# Patient Record
Sex: Male | Born: 1970 | Race: White | Hispanic: No | Marital: Married | Smoking: Never smoker
Health system: Southern US, Community
[De-identification: ages and names within clinical notes are randomized; demographics above are authoritative.]

## PROBLEM LIST (undated history)

## (undated) DIAGNOSIS — M109 Gout, unspecified: Secondary | ICD-10-CM

## (undated) DIAGNOSIS — M1990 Unspecified osteoarthritis, unspecified site: Secondary | ICD-10-CM

## (undated) HISTORY — PX: APPENDECTOMY: SHX54

---

## 2020-05-20 ENCOUNTER — Emergency Department (HOSPITAL_COMMUNITY): Payer: No Typology Code available for payment source

## 2020-05-20 ENCOUNTER — Other Ambulatory Visit: Payer: Self-pay

## 2020-05-20 ENCOUNTER — Encounter (HOSPITAL_COMMUNITY): Payer: Self-pay | Admitting: Emergency Medicine

## 2020-05-20 ENCOUNTER — Emergency Department (HOSPITAL_COMMUNITY)
Admission: EM | Admit: 2020-05-20 | Discharge: 2020-05-20 | Disposition: A | Discharge status: Home / Self Care | Payer: No Typology Code available for payment source | Attending: Emergency Medicine | Admitting: Emergency Medicine

## 2020-05-20 DIAGNOSIS — R109 Unspecified abdominal pain: Secondary | ICD-10-CM | POA: Insufficient documentation

## 2020-05-20 DIAGNOSIS — R030 Elevated blood-pressure reading, without diagnosis of hypertension: Secondary | ICD-10-CM | POA: Diagnosis not present

## 2020-05-20 DIAGNOSIS — M791 Myalgia, unspecified site: Secondary | ICD-10-CM | POA: Insufficient documentation

## 2020-05-20 LAB — CBC
HCT: 45.6 % (ref 39.0–52.0)
Hemoglobin: 13.7 g/dL (ref 13.0–17.0)
MCH: 23.5 pg — ABNORMAL LOW (ref 26.0–34.0)
MCHC: 30 g/dL (ref 30.0–36.0)
MCV: 78.2 fL — ABNORMAL LOW (ref 80.0–100.0)
Platelets: 331 10*3/uL (ref 150–400)
RBC: 5.83 MIL/uL — ABNORMAL HIGH (ref 4.22–5.81)
RDW: 19.5 % — ABNORMAL HIGH (ref 11.5–15.5)
WBC: 7.8 10*3/uL (ref 4.0–10.5)
nRBC: 0 % (ref 0.0–0.2)

## 2020-05-20 LAB — URINALYSIS, ROUTINE W REFLEX MICROSCOPIC
Bilirubin Urine: NEGATIVE
Glucose, UA: NEGATIVE mg/dL
Hgb urine dipstick: NEGATIVE
Ketones, ur: NEGATIVE mg/dL
Leukocytes,Ua: NEGATIVE
Nitrite: NEGATIVE
Protein, ur: NEGATIVE mg/dL
Specific Gravity, Urine: 1.008 (ref 1.005–1.030)
pH: 6 (ref 5.0–8.0)

## 2020-05-20 LAB — BASIC METABOLIC PANEL
Anion gap: 10 (ref 5–15)
BUN: 21 mg/dL — ABNORMAL HIGH (ref 6–20)
CO2: 24 mmol/L (ref 22–32)
Calcium: 8.7 mg/dL — ABNORMAL LOW (ref 8.9–10.3)
Chloride: 103 mmol/L (ref 98–111)
Creatinine, Ser: 1.33 mg/dL — ABNORMAL HIGH (ref 0.61–1.24)
GFR calc Af Amer: >60 mL/min (ref >60)
GFR calc non Af Amer: >60 mL/min (ref >60)
Glucose, Bld: 118 mg/dL — ABNORMAL HIGH (ref 70–99)
Potassium: 4.2 mmol/L (ref 3.5–5.1)
Sodium: 137 mmol/L (ref 135–145)

## 2020-05-20 LAB — LIPASE, BLOOD: Lipase: 36 U/L (ref 11–51)

## 2020-05-20 MED ORDER — ONDANSETRON HCL 4 MG/2ML IJ SOLN
4.0000 mg | Freq: Once | INTRAMUSCULAR | Status: AC
  Administered 2020-05-20: 4 mg via INTRAVENOUS
  Filled 2020-05-20: qty 2

## 2020-05-20 MED ORDER — FENTANYL CITRATE (PF) 100 MCG/2ML IJ SOLN
100.0000 ug | Freq: Once | INTRAMUSCULAR | Status: AC
  Administered 2020-05-20: 100 ug via INTRAVENOUS
  Filled 2020-05-20: qty 2

## 2020-05-20 MED ORDER — SODIUM CHLORIDE 0.9 % IV BOLUS (SEPSIS)
1000.0000 mL | Freq: Once | INTRAVENOUS | Status: AC
  Administered 2020-05-20: 1000 mL via INTRAVENOUS

## 2020-05-20 NOTE — ED Triage Notes (Signed)
Patient states right abdominal and right sided flank pain that started on Tuesday with vomiting. Patient states that he had his second COVID vaccine on 05/18/20 and states numbness in his legs.

## 2020-05-20 NOTE — ED Provider Notes (Signed)
[facility] EMERGENCY DEPARTMENT Provider Note   CSN: [id number] Arrival date & time: 05/20/20  0533     History Chief Complaint  Patient presents with  . Flank Pain    right    Nicholas Harper is a 49 y.o. male.  The history is provided by the patient.  Flank Pain This is a new problem. The current episode started 2 days ago. The problem occurs constantly. The problem has been gradually worsening. Associated symptoms include abdominal pain. Pertinent negatives include no chest pain and no shortness of breath. Exacerbated by: Certain positions. Nothing relieves the symptoms. He has tried nothing for the symptoms.  Patient reports over 2 days ago he began having right flank pain and bruising to his abdomen.  No fevers or vomiting.  No difficulty urinating, but does report dark urine.  No groin pain.  He reports this feels similar to when he was told he had a fatty liver 18 months ago He reports history of kidney stones over 20 years ago Patient also reports muscle tightness in his arms and legs since receiving Covid vaccine yesterday.  The abdominal pain preceded the Covid vaccine No new arm or leg weakness.  No incontinence    PMH-"fatty liver" Social History   Tobacco Use  . Smoking status: Never Smoker  . Smokeless tobacco: Never Used  Substance Use Topics  . Alcohol use: Not Currently  . Drug use: Never    Home Medications Prior to Admission medications   Not on File    Allergies    Patient has no allergy information on record.  Review of Systems   Review of Systems  Constitutional: Negative for fever.  Respiratory: Negative for shortness of breath.   Cardiovascular: Negative for chest pain.  Gastrointestinal: Positive for abdominal pain.  Genitourinary: Positive for flank pain. Negative for testicular pain.  Musculoskeletal: Positive for myalgias.  All other systems reviewed and are negative.   Physical Exam Updated Vital Signs BP (!) 160/101 (BP Location:  Left Arm)   Pulse 73   Temp (!) 97.5 F (36.4 C) (Oral)   Resp 18   Ht 1.778 m (5\' 10" )   Wt 102.1 kg   SpO2 99%   BMI 32.28 kg/m   Physical Exam CONSTITUTIONAL: Well developed/well nourished, mildly uncomfortable appearing HEAD: Normocephalic/atraumatic EYES: EOMI/PERRL ENMT: Mucous membranes moist NECK: supple no meningeal signs SPINE/BACK:entire spine nontender CV: S1/S2 noted, no murmurs/rubs/gallops noted LUNGS: Lungs are clear to auscultation bilaterally, no apparent distress ABDOMEN: soft, mild RUQ tenderness, no organomegaly, no rebound or guarding, bowel sounds noted throughout abdomen GU: Right cva tenderness NEURO: Pt is awake/alert/appropriate, moves all extremitiesx4.  No facial droop.  No focal motor deficits noted lower extremity EXTREMITIES: pulses normal/equal, full ROM SKIN: warm, diaphoretic PSYCH: no abnormalities of mood noted, alert and oriented to situation  ED Results / Procedures / Treatments   Labs (all labs ordered are listed, but only abnormal results are displayed) Labs Reviewed  CBC - Abnormal; Notable for the following components:      Result Value   RBC 5.83 (*)    MCV 78.2 (*)    MCH 23.5 (*)    RDW 19.5 (*)    All other components within normal limits  BASIC METABOLIC PANEL - Abnormal; Notable for the following components:   Glucose, Bld 118 (*)    BUN 21 (*)    Creatinine, Ser 1.33 (*)    Calcium 8.7 (*)    All other components within normal limits  LIPASE, BLOOD  URINALYSIS, ROUTINE W REFLEX MICROSCOPIC    EKG None  Radiology No results found.  Procedures Procedures    Medications Ordered in ED Medications  fentaNYL (SUBLIMAZE) injection 100 mcg (100 mcg Intravenous Given 05/20/20 0632)  ondansetron (ZOFRAN) injection 4 mg (4 mg Intravenous Given 05/20/20 9381)    ED Course  I have reviewed the triage vital signs and the nursing notes.  Pertinent labs & imaging results that were available during my care of the patient  were reviewed by me and considered in my medical decision making (see chart for details).    MDM Rules/Calculators/A&P                       This patient presents to the ED for concern of abdominal pain and flank pain, this involves an extensive number of treatment options, and is a complaint that carries with it a high risk of complications and morbidity.  The differential diagnosis includes ureteral stone, pyelonephritis, cholecystitis, pancreatitis   Lab Tests:   I Ordered, reviewed, and interpreted labs, which included urinalysis, electrolytes, complete blood count, lipase  Medicines ordered:   I ordered medication fentanyl for pain  Imaging Studies ordered:   I ordered imaging studies which included CT renal study    6:49 AM Patient presents with right flank pain of unclear etiology.  Patient appears uncomfortable.  High suspicion for ureteral stone.  Will order CT renal study.  Urinalysis pending 7:04 AM Signed out to dr Ashok Cordia at shift change  Final Clinical Impression(s) / ED Diagnoses Final diagnoses:  Flank pain    Rx / DC Orders ED Discharge Orders    None       Ripley Fraise, MD 05/20/20 [phone]

## 2020-05-20 NOTE — ED Provider Notes (Signed)
Signed out that pt getting renal ct, r/o ureteral stone - check ct and d/c to home.   CT neg for ureteral stone. Ct does not bil kidney stones, and other incidental findings - discussed w pt, and rec pcp f/u.  Pt denies chronic or recurrent abd pain. No acute or chronic diarrhea. Currently no pain. abd soft nt. Afebrile.   Pt appears stable for d/c.      Cathren Laine, MD 05/20/20 [phone]

## 2020-05-20 NOTE — Discharge Instructions (Addendum)
It was our pleasure to provide your ER care today - we hope that you feel better.  Your ct scan was read as showing no obstructing kidney stone, however, several incidental findings were noted (see below, and discuss with primary care doctor at follow up). IMPRESSION: 1. Low-attenuation in the submucosa of distal small bowel and proximal colon. Findings could be seen in the setting of mild or prior enterocolitis. No significant pericolonic or perienteric stranding. Correlate with any history of recurrent abdominal pain. This finding can be seen in the setting of inflammatory bowel disease but is nonspecific. 2. No evidence of hydronephrosis or perinephric fluid. Minimal perinephric stranding bilaterally. Findings may be chronic, no prior exams for comparison. Correlation with urinalysis may be helpful. 3. Nonobstructive bilateral nephrolithiasis. 4. Hepatic steatosis. 5. Colonic diverticulosis without evidence of acute diverticulitis.   Your blood pressure is mildly high today - follow up with primary care doctor for recheck in 1-2 weeks.   Return to ER if worse, new symptoms, fevers, worsening or severe pain, persistent vomiting, or other concern.

## 2021-01-26 ENCOUNTER — Emergency Department (HOSPITAL_COMMUNITY): Payer: BC Managed Care – PPO

## 2021-01-26 ENCOUNTER — Encounter (HOSPITAL_COMMUNITY): Payer: Self-pay

## 2021-01-26 ENCOUNTER — Other Ambulatory Visit: Payer: Self-pay

## 2021-01-26 ENCOUNTER — Emergency Department (HOSPITAL_COMMUNITY)
Admission: EM | Admit: 2021-01-26 | Discharge: 2021-01-26 | Disposition: A | Discharge status: Home / Self Care | Payer: BC Managed Care – PPO | Attending: Emergency Medicine | Admitting: Emergency Medicine

## 2021-01-26 DIAGNOSIS — R609 Edema, unspecified: Secondary | ICD-10-CM | POA: Insufficient documentation

## 2021-01-26 DIAGNOSIS — R072 Precordial pain: Secondary | ICD-10-CM | POA: Diagnosis not present

## 2021-01-26 DIAGNOSIS — R55 Syncope and collapse: Secondary | ICD-10-CM | POA: Diagnosis not present

## 2021-01-26 DIAGNOSIS — R0602 Shortness of breath: Secondary | ICD-10-CM | POA: Diagnosis present

## 2021-01-26 DIAGNOSIS — U071 COVID-19: Secondary | ICD-10-CM | POA: Insufficient documentation

## 2021-01-26 LAB — CBC WITH DIFFERENTIAL/PLATELET
Abs Immature Granulocytes: 0.02 10*3/uL (ref 0.00–0.07)
Basophils Absolute: 0 10*3/uL (ref 0.0–0.1)
Basophils Relative: 0 %
Eosinophils Absolute: 0.1 10*3/uL (ref 0.0–0.5)
Eosinophils Relative: 1 %
HCT: 49 % (ref 39.0–52.0)
Hemoglobin: 15.6 g/dL (ref 13.0–17.0)
Immature Granulocytes: 0 %
Lymphocytes Relative: 22 %
Lymphs Abs: 1.8 10*3/uL (ref 0.7–4.0)
MCH: 26.3 pg (ref 26.0–34.0)
MCHC: 31.8 g/dL (ref 30.0–36.0)
MCV: 82.6 fL (ref 80.0–100.0)
Monocytes Absolute: 0.7 10*3/uL (ref 0.1–1.0)
Monocytes Relative: 9 %
Neutro Abs: 5.5 10*3/uL (ref 1.7–7.7)
Neutrophils Relative %: 68 %
Platelets: 261 10*3/uL (ref 150–400)
RBC: 5.93 MIL/uL — ABNORMAL HIGH (ref 4.22–5.81)
RDW: 16.4 % — ABNORMAL HIGH (ref 11.5–15.5)
WBC: 8.2 10*3/uL (ref 4.0–10.5)
nRBC: 0 % (ref 0.0–0.2)

## 2021-01-26 LAB — URINALYSIS, ROUTINE W REFLEX MICROSCOPIC
Bilirubin Urine: NEGATIVE
Glucose, UA: NEGATIVE mg/dL
Hgb urine dipstick: NEGATIVE
Ketones, ur: NEGATIVE mg/dL
Leukocytes,Ua: NEGATIVE
Nitrite: NEGATIVE
Protein, ur: NEGATIVE mg/dL
Specific Gravity, Urine: 1.016 (ref 1.005–1.030)
pH: 5 (ref 5.0–8.0)

## 2021-01-26 LAB — COMPREHENSIVE METABOLIC PANEL
ALT: 53 U/L — ABNORMAL HIGH (ref 0–44)
AST: 35 U/L (ref 15–41)
Albumin: 4 g/dL (ref 3.5–5.0)
Alkaline Phosphatase: 46 U/L (ref 38–126)
Anion gap: 7 (ref 5–15)
BUN: 15 mg/dL (ref 6–20)
CO2: 21 mmol/L — ABNORMAL LOW (ref 22–32)
Calcium: 8.5 mg/dL — ABNORMAL LOW (ref 8.9–10.3)
Chloride: 108 mmol/L (ref 98–111)
Creatinine, Ser: 1.06 mg/dL (ref 0.61–1.24)
GFR, Estimated: >60 mL/min (ref >60)
Glucose, Bld: 119 mg/dL — ABNORMAL HIGH (ref 70–99)
Potassium: 4 mmol/L (ref 3.5–5.1)
Sodium: 136 mmol/L (ref 135–145)
Total Bilirubin: 0.9 mg/dL (ref 0.3–1.2)
Total Protein: 7.4 g/dL (ref 6.5–8.1)

## 2021-01-26 LAB — D-DIMER, QUANTITATIVE: D-Dimer, Quant: 0.6 ug/mL-FEU — ABNORMAL HIGH (ref 0.00–0.50)

## 2021-01-26 LAB — BRAIN NATRIURETIC PEPTIDE: B Natriuretic Peptide: 24 pg/mL (ref 0.0–100.0)

## 2021-01-26 LAB — TROPONIN I (HIGH SENSITIVITY): Troponin I (High Sensitivity): 5 ng/L (ref <18)

## 2021-01-26 MED ORDER — LACTATED RINGERS IV BOLUS
1000.0000 mL | Freq: Once | INTRAVENOUS | Status: AC
  Administered 2021-01-26: 1000 mL via INTRAVENOUS

## 2021-01-26 MED ORDER — NAPROXEN 500 MG PO TABS
500.0000 mg | ORAL_TABLET | Freq: Two times a day (BID) | ORAL | 0 refills | Status: DC
Start: 2021-01-26 — End: 2021-05-04

## 2021-01-26 MED ORDER — IOHEXOL 350 MG/ML SOLN
100.0000 mL | Freq: Once | INTRAVENOUS | Status: AC | PRN
  Administered 2021-01-26: 100 mL via INTRAVENOUS

## 2021-01-26 NOTE — ED Notes (Signed)
Back in room from CT

## 2021-01-26 NOTE — Discharge Instructions (Addendum)
Follow up with Dr. Dallas Schimke next week for your arm.   Follow up with a family md if any problems

## 2021-01-26 NOTE — ED Triage Notes (Addendum)
Pt says he tested + last Thursday. Says that a "knot" popped up on his left elbow yesterday and has swelling in his right hand. States he "blacked out" for 2 hours today. Complains of fevers and some blood when coughs. Denies SOB.

## 2021-01-26 NOTE — ED Provider Notes (Signed)
[facility] EMERGENCY DEPARTMENT Provider Note   CSN: [id number] Arrival date & time: 01/26/21  0454     History Chief Complaint  Patient presents with  . Covid Positive    Nicholas Harper is a 50 y.o. male.  Known covid. Now with RUE edema, syncope, sob and mild chest pain.    Chest Pain Pain location:  Substernal area Pain quality: aching and sharp   Pain radiates to:  Does not radiate Pain severity:  Mild Duration:  3 days Timing:  Constant Chronicity:  New Relieved by:  None tried Worsened by:  Nothing Ineffective treatments:  None tried Associated symptoms: cough and syncope   Associated symptoms: no anxiety, no back pain, no dizziness and no lower extremity edema        History reviewed. No pertinent past medical history.  There are no problems to display for this patient.   History reviewed. No pertinent surgical history.     No family history on file.  Social History   Tobacco Use  . Smoking status: Never Smoker  . Smokeless tobacco: Never Used  Substance Use Topics  . Alcohol use: Not Currently  . Drug use: Never    Home Medications Prior to Admission medications   Not on File    Allergies    Patient has no allergy information on record.  Review of Systems   Review of Systems  Respiratory: Positive for cough.   Cardiovascular: Positive for chest pain and syncope.  Musculoskeletal: Negative for back pain.  Neurological: Negative for dizziness.  All other systems reviewed and are negative.   Physical Exam Updated Vital Signs BP 129/84   Pulse 72   Temp 97.8 F (36.6 C) (Oral)   Resp (!) 24   Ht 5\' 10"  (1.778 m)   Wt 95.3 kg   SpO2 100%   BMI 30.13 kg/m   Physical Exam Vitals and nursing note reviewed.  Constitutional:      Appearance: He is well-developed and well-nourished.  HENT:     Head: Normocephalic and atraumatic.     Nose: No congestion or rhinorrhea.     Mouth/Throat:     Mouth: Mucous membranes are moist.      Pharynx: Oropharynx is clear.  Eyes:     Pupils: Pupils are equal, round, and reactive to light.  Cardiovascular:     Rate and Rhythm: Normal rate.  Pulmonary:     Effort: Pulmonary effort is normal. No respiratory distress.  Abdominal:     General: Abdomen is flat. There is no distension.  Musculoskeletal:        General: Swelling (RUE) present. No tenderness. Normal range of motion.     Cervical back: Normal range of motion.  Skin:    General: Skin is warm and dry.  Neurological:     General: No focal deficit present.     Mental Status: He is alert.     ED Results / Procedures / Treatments   Labs (all labs ordered are listed, but only abnormal results are displayed) Labs Reviewed  CBC WITH DIFFERENTIAL/PLATELET - Abnormal; Notable for the following components:      Result Value   RBC 5.93 (*)    RDW 16.4 (*)    All other components within normal limits  COMPREHENSIVE METABOLIC PANEL - Abnormal; Notable for the following components:   CO2 21 (*)    Glucose, Bld 119 (*)    Calcium 8.5 (*)    ALT 53 (*)  All other components within normal limits  D-DIMER, QUANTITATIVE (NOT AT [facility]) - Abnormal; Notable for the following components:   D-Dimer, Quant 0.60 (*)    All other components within normal limits  BRAIN NATRIURETIC PEPTIDE  URINALYSIS, ROUTINE W REFLEX MICROSCOPIC  TROPONIN I (HIGH SENSITIVITY)    EKG EKG Interpretation  Date/Time:  Thursday January 26 2021 06:00:16 EST Ventricular Rate:  79 PR Interval:    QRS Duration: 94 QT Interval:  379 QTC Calculation: 435 R Axis:   -56 Text Interpretation: Sinus rhythm Left axis deviation No old tracing to compare Confirmed by Marily Memos [phone]) on 01/26/2021 6:14:48 AM   Radiology DG Chest Portable 1 View  Result Date: 01/26/2021 CLINICAL DATA:  50 year old male with fever coughing and hemoptysis. Positive COVID-19. For 1 week. EXAM: PORTABLE CHEST 1 VIEW COMPARISON:  CT Abdomen and Pelvis 05/20/2020.  FINDINGS: Portable AP upright view at 0555 hours. Normal lung volumes and mediastinal contours. Allowing for portable technique the lungs are clear. No pneumothorax. Visualized tracheal air column is within normal limits. Negative visible osseous structures. IMPRESSION: Negative portable chest. Electronically Signed   By: Odessa Fleming M.D.   On: 01/26/2021 06:04    Procedures Procedures   Medications Ordered in ED Medications  lactated ringers bolus 1,000 mL (1,000 mLs Intravenous New Bag/Given 01/26/21 0608)    ED Course  I have reviewed the triage vital signs and the nursing notes.  Pertinent labs & imaging results that were available during my care of the patient were reviewed by me and considered in my medical decision making (see chart for details).    MDM Rules/Calculators/A&P                         Apparently has a history of syncopal episodes multiple times.  This month sounds more orthostatic in nature.  We will going give some fluids, check a D-dimer, EKG and basic labs.  Based on those will work-up as appropriate Symptoms improved however his D-dimer slightly elevated so will order: Ct for PE [place] for DVT  Care transferred pending both and reevaluation for ultimate disposition per.   Final Clinical Impression(s) / ED Diagnoses Final diagnoses:  Swelling    Rx / DC Orders ED Discharge Orders    None       Analeese Andreatta, Barbara Cower, MD 01/27/21 [phone]

## 2021-01-26 NOTE — ED Notes (Signed)
Pt to CT

## 2021-03-03 ENCOUNTER — Other Ambulatory Visit: Payer: Self-pay

## 2021-03-03 ENCOUNTER — Encounter: Payer: Self-pay | Admitting: Emergency Medicine

## 2021-03-03 ENCOUNTER — Ambulatory Visit
Admission: EM | Admit: 2021-03-03 | Discharge: 2021-03-03 | Disposition: A | Discharge status: Home / Self Care | Payer: BC Managed Care – PPO | Attending: Emergency Medicine | Admitting: Emergency Medicine

## 2021-03-03 DIAGNOSIS — M2550 Pain in unspecified joint: Secondary | ICD-10-CM

## 2021-03-03 DIAGNOSIS — M2540 Effusion, unspecified joint: Secondary | ICD-10-CM

## 2021-03-03 HISTORY — DX: Gout, unspecified: M10.9

## 2021-03-03 MED ORDER — PREDNISONE 10 MG (21) PO TBPK
ORAL_TABLET | Freq: Every day | ORAL | 0 refills | Status: DC
Start: 2021-03-03 — End: 2021-05-04

## 2021-03-03 NOTE — ED Provider Notes (Signed)
[facility] CARE CENTER   [id number] 03/03/21 Arrival Time: 1054  CC: Multiple joint pain  SUBJECTIVE: History from: patient. Nicholas Harper is a 50 y.o. male complains of multiple joint pain x 2 days.  Denies a precipitating event or specific injury.  Diffuse about RT hand, LT elbow, LT knee, and bilateral feet.  Describes the pain as intermittent and burning/ sharp in character.  Has tried naproxen without relief.  Symptoms are made worse with to the touch.  Reports similar symptoms in the past with arthritis flare, improved with prednisone taper.  Denies fever, chills, erythema, ecchymosis, effusion, weakness.  Has appt with arthritis clinic in April  ROS: As per HPI.  All other pertinent ROS negative.     Past Medical History:  Diagnosis Date  . Gout    Past Surgical History:  Procedure Laterality Date  . APPENDECTOMY     No Known Allergies No current facility-administered medications on file prior to encounter.   Current Outpatient Medications on File Prior to Encounter  Medication Sig Dispense Refill  . naproxen (NAPROSYN) 500 MG tablet Take 1 tablet (500 mg total) by mouth 2 (two) times daily. 30 tablet 0   Social History   Socioeconomic History  . Marital status: Married    Spouse name: Not on file  . Number of children: Not on file  . Years of education: Not on file  . Highest education level: Not on file  Occupational History  . Not on file  Tobacco Use  . Smoking status: Never Smoker  . Smokeless tobacco: Never Used  Substance and Sexual Activity  . Alcohol use: Not Currently  . Drug use: Never  . Sexual activity: Yes  Other Topics Concern  . Not on file  Social History Narrative  . Not on file   Social Determinants of Health   Financial Resource Strain: Not on file  Food Insecurity: Not on file  Transportation Needs: Not on file  Physical Activity: Not on file  Stress: Not on file  Social Connections: Not on file  Intimate Partner Violence: Not on  file   No family history on file.  OBJECTIVE:  Vitals:   03/03/21 1111  BP: (!) 139/93  Pulse: 92  Resp: 19  Temp: 98.3 F (36.8 C)  TempSrc: Oral  SpO2: 95%    General appearance: ALERT; in no acute distress.  Head: NCAT Lungs: Normal respiratory effort Musculoskeletal: Inspection: mild diffuse swelling over RT hand, LT elbow, and LT knee Palpation: Diffuse TTP over RT hand, LT elbow, LT knee and bilateral feet Skin: warm and dry Neurologic: Ambulates with minimal difficulty Psychological: alert and cooperative; normal mood and affect  ASSESSMENT & PLAN:  1. Multiple joint pain   2. Joint swelling    Meds ordered this encounter  Medications  . predniSONE (STERAPRED UNI-PAK 21 TAB) 10 MG (21) TBPK tablet    Sig: Take by mouth daily. Take 6 tabs by mouth daily  for 2 days, then 5 tabs for 2 days, then 4 tabs for 2 days, then 3 tabs for 2 days, 2 tabs for 2 days, then 1 tab by mouth daily for 2 days    Dispense:  42 tablet    Refill:  0    Order Specific Question:   Supervising Provider    Answer:   Eustace Moore [[id number]]    Continue conservative management of rest, ice, and gentle stretches Prednisone taper prescribed.  Take as directed and to completion Follow up with arthritis  clinic for further evaluation and management Return or go to the ER if you have any new or worsening symptoms (fever, chills, chest pain, redness, swelling, bruising, etc...)   Reviewed expectations re: course of current medical issues. Questions answered. Outlined signs and symptoms indicating need for more acute intervention. Patient verbalized understanding. After Visit Summary given.    Rennis Harding, PA-C 03/03/21 1138

## 2021-03-03 NOTE — Discharge Instructions (Signed)
Continue conservative management of rest, ice, and gentle stretches Prednisone taper prescribed.  Take as directed and to completion Follow up with arthritis clinic for further evaluation and management Return or go to the ER if you have any new or worsening symptoms (fever, chills, chest pain, redness, swelling, bruising, etc...)

## 2021-03-03 NOTE — ED Triage Notes (Signed)
Swelling and pain to RT hand, bilateral feet, LT knee and elbow since Wednesday.

## 2021-05-04 ENCOUNTER — Other Ambulatory Visit: Payer: Self-pay

## 2021-05-04 ENCOUNTER — Ambulatory Visit
Admission: EM | Admit: 2021-05-04 | Discharge: 2021-05-04 | Disposition: A | Discharge status: Home / Self Care | Payer: BC Managed Care – PPO | Attending: Emergency Medicine | Admitting: Emergency Medicine

## 2021-05-04 ENCOUNTER — Encounter: Payer: Self-pay | Admitting: Emergency Medicine

## 2021-05-04 DIAGNOSIS — M25522 Pain in left elbow: Secondary | ICD-10-CM

## 2021-05-04 DIAGNOSIS — M25822 Other specified joint disorders, left elbow: Secondary | ICD-10-CM

## 2021-05-04 DIAGNOSIS — M79642 Pain in left hand: Secondary | ICD-10-CM

## 2021-05-04 MED ORDER — PREDNISONE 20 MG PO TABS
20.0000 mg | ORAL_TABLET | Freq: Two times a day (BID) | ORAL | 0 refills | Status: AC
Start: 2021-05-04 — End: 2021-05-09

## 2021-05-04 MED ORDER — DOXYCYCLINE HYCLATE 100 MG PO CAPS
100.0000 mg | ORAL_CAPSULE | Freq: Two times a day (BID) | ORAL | 0 refills | Status: DC
Start: 2021-05-04 — End: 2021-08-18

## 2021-05-04 NOTE — ED Triage Notes (Signed)
Both hands and fingers swelling x 3 days.

## 2021-05-04 NOTE — ED Provider Notes (Signed)
[facility] CARE CENTER   [id number] 05/04/21 Arrival Time: 1124  PY:KDXIP PAIN  SUBJECTIVE: History from: patient. Nicholas Harper is a 50 y.o. male complains of LT hand and elbow pain and swelling x few days.  Denies a precipitating event or specific injury.  Localizes the pain to the inside of hand and outside of elbow.  Describes the pain as intermittent.  Has tried OTC medications without relief.  Symptoms are made worse with activity.  Denies similar symptoms in the past.  Reports intermittent numbness/ tingling.  Denies fever, chills, erythema, ecchymosis, weakness  ROS: As per HPI.  All other pertinent ROS negative.     Past Medical History:  Diagnosis Date  . Gout    Past Surgical History:  Procedure Laterality Date  . APPENDECTOMY     No Known Allergies No current facility-administered medications on file prior to encounter.   No current outpatient medications on file prior to encounter.   Social History   Socioeconomic History  . Marital status: Married    Spouse name: Not on file  . Number of children: Not on file  . Years of education: Not on file  . Highest education level: Not on file  Occupational History  . Not on file  Tobacco Use  . Smoking status: Never Smoker  . Smokeless tobacco: Never Used  Substance and Sexual Activity  . Alcohol use: Not Currently  . Drug use: Never  . Sexual activity: Yes  Other Topics Concern  . Not on file  Social History Narrative  . Not on file   Social Determinants of Health   Financial Resource Strain: Not on file  Food Insecurity: Not on file  Transportation Needs: Not on file  Physical Activity: Not on file  Stress: Not on file  Social Connections: Not on file  Intimate Partner Violence: Not on file   Family History  Problem Relation Age of Onset  . Cancer Mother   . COPD Father     OBJECTIVE:  Vitals:   05/04/21 1311  BP: (!) 183/118  Pulse: 81  Resp: 18  Temp: 98.1 F (36.7 C)  TempSrc: Oral   SpO2: 96%    General appearance: ALERT; in no acute distress.  Head: NCAT Lungs: Normal respiratory effort CV: radial pulse 2+ Musculoskeletal: LT UE Inspection: irregular mass/ swelling over LT elbow apx 3 cm in diameter oblong shape, some overlying pustules Palpation: diffusely TTP over elbow ROM: FROM active and passive Skin: warm and dry Neurologic: Ambulates without difficulty; Sensation intact about the upper/ lower extremities Psychological: alert and cooperative; normal mood and affect  ASSESSMENT & PLAN:  1. Mass of joint of left elbow   2. Elbow pain, left   3. Hand pain, left     Meds ordered this encounter  Medications  . doxycycline (VIBRAMYCIN) 100 MG capsule    Sig: Take 1 capsule (100 mg total) by mouth 2 (two) times daily.    Dispense:  20 capsule    Refill:  0    Order Specific Question:   Supervising Provider    Answer:   Eustace Moore [[id number]]  . predniSONE (DELTASONE) 20 MG tablet    Sig: Take 1 tablet (20 mg total) by mouth 2 (two) times daily with a meal for 5 days.    Dispense:  10 tablet    Refill:  0    Order Specific Question:   Supervising Provider    Answer:   Eustace Moore [[id number]]   Wash  with warm water and mild soap Continue conservative management of rest, ice, and elevation Prednisone for pain and swelling Doxycycline for possible infection Follow up with orthopedist for further evaluation and management Return or go to the ER if you have any new or worsening symptoms (fever, chills, chest pain, redness, swelling, drainage, etc...)    Reviewed expectations re: course of current medical issues. Questions answered. Outlined signs and symptoms indicating need for more acute intervention. Patient verbalized understanding. After Visit Summary given.    Rennis Harding, PA-C 05/04/21 1349

## 2021-05-04 NOTE — Discharge Instructions (Signed)
Wash with warm water and mild soap Continue conservative management of rest, ice, and elevation Prednisone for pain and swelling Doxycycline for possible infection Follow up with orthopedist for further evaluation and management Return or go to the ER if you have any new or worsening symptoms (fever, chills, chest pain, redness, swelling, drainage, etc...)

## 2021-06-27 ENCOUNTER — Ambulatory Visit
Admission: EM | Admit: 2021-06-27 | Discharge: 2021-06-27 | Disposition: A | Discharge status: Home / Self Care | Payer: BC Managed Care – PPO | Attending: Family Medicine | Admitting: Family Medicine

## 2021-06-27 ENCOUNTER — Other Ambulatory Visit: Payer: Self-pay

## 2021-06-27 ENCOUNTER — Encounter: Payer: Self-pay | Admitting: Emergency Medicine

## 2021-06-27 DIAGNOSIS — M19042 Primary osteoarthritis, left hand: Secondary | ICD-10-CM | POA: Diagnosis not present

## 2021-06-27 MED ORDER — DICLOFENAC SODIUM 50 MG PO TBEC
50.0000 mg | DELAYED_RELEASE_TABLET | Freq: Two times a day (BID) | ORAL | 0 refills | Status: DC
Start: 2021-06-27 — End: 2021-08-18

## 2021-06-27 MED ORDER — PREDNISONE 20 MG PO TABS
20.0000 mg | ORAL_TABLET | Freq: Every day | ORAL | 0 refills | Status: DC
Start: 2021-06-27 — End: 2021-08-18

## 2021-06-27 NOTE — ED Triage Notes (Signed)
Swelling to RT  since yesterday. Difficulty moving index,middle and ring finger.  Denies any injury.

## 2021-06-27 NOTE — ED Provider Notes (Signed)
[facility]    CSN: [id number] Arrival date & time: 06/27/21  0957      History   Chief Complaint No chief complaint on file.   HPI Nicholas Harper is a 50 y.o. male.   HPI Patient with a history of recurrent joint swelling.  He has been making efforts to get in with a rheumatologist and has obtained an appointment in [facility] and will not be seen for the next couple weeks.  This been a recurrent issue with bilateral hand and joint swelling involving multiple joints.  He has not had any formal work-up for RA or gout.  He has been treated here at urgent care with steroids which have resolved the joint swelling and pain.  Current episode of pain started yesterday and swelling has gradually worsened involving the right fingers and right hand.  In his current role at work he is constantly using his hands and feels this is likely exacerbating current symptoms.    Past Medical History:  Diagnosis Date   Gout     There are no problems to display for this patient.   Past Surgical History:  Procedure Laterality Date   APPENDECTOMY         Home Medications    Prior to Admission medications   Medication Sig Start Date End Date Taking? Authorizing Provider  doxycycline (VIBRAMYCIN) 100 MG capsule Take 1 capsule (100 mg total) by mouth 2 (two) times daily. 05/04/21   Rennis Harding, PA-C    Family History Family History  Problem Relation Age of Onset   Cancer Mother    COPD Father     Social History Social History   Tobacco Use   Smoking status: Never   Smokeless tobacco: Never  Substance Use Topics   Alcohol use: Not Currently   Drug use: Never     Allergies   Patient has no known allergies.   Review of Systems Review of Systems Pertinent negatives listed in HPI   Physical Exam Triage Vital Signs ED Triage Vitals  Enc Vitals Group     BP 06/27/21 1011 (!) 166/105     Pulse Rate 06/27/21 1011 76     Resp 06/27/21 1011 17     Temp  06/27/21 1011 98.1 F (36.7 C)     Temp Source 06/27/21 1011 Oral     SpO2 06/27/21 1011 97 %     Weight --      Height --      Head Circumference --      Peak Flow --      Pain Score 06/27/21 1012 8     Pain Loc --      Pain Edu? --      Excl. in GC? --    No data found.  Updated Vital Signs BP (!) 166/105 (BP Location: Right Arm)   Pulse 76   Temp 98.1 F (36.7 C) (Oral)   Resp 17   SpO2 97%   Visual Acuity Right Eye Distance:   Left Eye Distance:   Bilateral Distance:    Right Eye Near:   Left Eye Near:    Bilateral Near:     Physical Exam General appearance: alert, well developed, well nourished, cooperative and in no distress Head: Normocephalic, without obvious abnormality, atraumatic Respiratory: Respirations even and unlabored, normal respiratory rate Heart: rate and rhythm normal. No gallop or murmurs noted on exam  Extremities: Multijoint swelling involving the right fingers, metacarpal region of right hand  Skin: Skin color, texture, turgor normal. No rashes seen  Psych: Appropriate mood and affect. Neurologic: No focal neurological deficits UC Treatments / Results  Labs (all labs ordered are listed, but only abnormal results are displayed) Labs Reviewed - No data to display  EKG   Radiology No results found.  Procedures Procedures (including critical care time)  Medications Ordered in UC Medications - No data to display  Initial Impression / Assessment and Plan / UC Course  I have reviewed the triage vital signs and the nursing notes.  Pertinent labs & imaging results that were available during my care of the patient were reviewed by me and considered in my medical decision making (see chart for details).    Inflammation involving the left hand joints.  Keep follow-up with rheumatology.  Start prednisone taper take as directed and complete entire course of medication.  Following completion of taper start Voltaren 50 mg twice daily.  Only  discontinue medication once evaluated by rheumatology.  Patient verbalized understanding and agreement with plan.  Final Clinical Impressions(s) / UC Diagnoses   Final diagnoses:  Inflammation of left hand joint     Discharge Instructions      Keep follow-up with Rheumatologist for further work-up of joint pain.     ED Prescriptions     Medication Sig Dispense Auth. Provider   predniSONE (DELTASONE) 20 MG tablet Take 1 tablet (20 mg total) by mouth daily with breakfast. Prednisone 20 mg, in mornings with breakfast as follows: Take 3 pills for 3 days, take 2 pills for 3 days, and take 1 pill for 3 days. 18 tablet Bing Neighbors, FNP   diclofenac (VOLTAREN) 50 MG EC tablet Take 1 tablet (50 mg total) by mouth 2 (two) times daily. 60 tablet Bing Neighbors, FNP      PDMP not reviewed this encounter.   Bing Neighbors, FNP 07/01/21 1746

## 2021-06-27 NOTE — Discharge Instructions (Addendum)
Keep follow-up with Rheumatologist for further work-up of joint pain.

## 2021-08-18 ENCOUNTER — Encounter: Payer: Self-pay | Admitting: Emergency Medicine

## 2021-08-18 ENCOUNTER — Ambulatory Visit
Admission: EM | Admit: 2021-08-18 | Discharge: 2021-08-18 | Disposition: A | Discharge status: Home / Self Care | Payer: BC Managed Care – PPO | Attending: Emergency Medicine | Admitting: Emergency Medicine

## 2021-08-18 DIAGNOSIS — L819 Disorder of pigmentation, unspecified: Secondary | ICD-10-CM

## 2021-08-18 DIAGNOSIS — M722 Plantar fascial fibromatosis: Secondary | ICD-10-CM | POA: Diagnosis not present

## 2021-08-18 MED ORDER — PREDNISONE 20 MG PO TABS
20.0000 mg | ORAL_TABLET | Freq: Two times a day (BID) | ORAL | 0 refills | Status: AC
Start: 2021-08-18 — End: 2021-08-23

## 2021-08-18 NOTE — Discharge Instructions (Signed)
Continue conservative management of rest, ice, and gentle stretches.  Use frozen water bottle Wear shoes with arch support; avoid walking barefoot Prop feet up and rest  Monitor LT toe for worsening discoloration, cold to the touch, decreased capillary refill, and/or numbness/ tingling- go to the ED immediately if you experience any of these symptoms Take prednisone Follow up with PCP or rheumatology for recheck Return or go to the ER if you have any new or worsening symptoms (fever, chills, chest pain, discoloration, cold to the touch, numbness/ tingling, etc...)

## 2021-08-18 NOTE — ED Triage Notes (Signed)
Swelling to bilateral feet and pain since Wednesday. Denies any injury.

## 2021-08-18 NOTE — ED Provider Notes (Signed)
[facility]   [id number] 08/18/21 Arrival Time: 1153  CC: bilateral feet PAIN  SUBJECTIVE: History from: patient. Nicholas Harper is a 50 y.o. male complains of bilateral foot pain x 2 days.  Denies a precipitating event or specific injury.  Localizes the pain to the arches of both feet.  Describes the pain as intermittent and sharp in character.  Has tried OTC medications without relief.  Symptoms are made worse with walking.  Denies similar symptoms in the past.  Denies fever, chills, erythema, ecchymosis, effusion, weakness, numbness and tingling.  Has appt with rheumatology on 9/14  ROS: As per HPI.  All other pertinent ROS negative.     Past Medical History:  Diagnosis Date   Gout    Past Surgical History:  Procedure Laterality Date   APPENDECTOMY     No Known Allergies No current facility-administered medications on file prior to encounter.   No current outpatient medications on file prior to encounter.   Social History   Socioeconomic History   Marital status: Married    Spouse name: Not on file   Number of children: Not on file   Years of education: Not on file   Highest education level: Not on file  Occupational History   Not on file  Tobacco Use   Smoking status: Never   Smokeless tobacco: Never  Substance and Sexual Activity   Alcohol use: Not Currently   Drug use: Never   Sexual activity: Yes  Other Topics Concern   Not on file  Social History Narrative   Not on file   Social Determinants of Health   Financial Resource Strain: Not on file  Food Insecurity: Not on file  Transportation Needs: Not on file  Physical Activity: Not on file  Stress: Not on file  Social Connections: Not on file  Intimate Partner Violence: Not on file   Family History  Problem Relation Age of Onset   Cancer Mother    COPD Father     OBJECTIVE:  Vitals:   08/18/21 1205  BP: (!) 150/106  Pulse: 88  Resp: 18  Temp: 99.2 F (37.3 C)  TempSrc: Oral  SpO2:  96%    General appearance: ALERT; in no acute distress.  Head: NCAT Lungs: Normal respiratory effort CV: dorsalis pulses 2+ bilaterally. Cap refill < 2 seconds Musculoskeletal: Bilateral feet Inspection: brawny skin changes to second digit LT foot, cap refill still intact, warm to the touch; Mild diffuse swelling about the feet Palpation: TTP over bilateral arches of feet Skin: warm and dry Neurologic: Ambulates without difficulty Psychological: alert and cooperative; normal mood and affect   ASSESSMENT & PLAN:  1. Plantar fasciitis, bilateral     Meds ordered this encounter  Medications   predniSONE (DELTASONE) 20 MG tablet    Sig: Take 1 tablet (20 mg total) by mouth 2 (two) times daily with a meal for 5 days.    Dispense:  10 tablet    Refill:  0    Order Specific Question:   Supervising Provider    Answer:   Eustace Moore [[id number]]    Continue conservative management of rest, ice, and gentle stretches.  Use frozen water bottle Wear shoes with arch support; avoid walking barefoot Prop feet up and rest  Monitor LT toe for worsening discoloration, cold to the touch, decreased capillary refill, and/or numbness/ tingling- go to the ED immediately if you experience any of these symptoms Take prednisone Follow up with PCP or rheumatology for  recheck Return or go to the ER if you have any new or worsening symptoms (fever, chills, chest pain, discoloration, cold to the touch, numbness/ tingling, etc...)   Reviewed expectations re: course of current medical issues. Questions answered. Outlined signs and symptoms indicating need for more acute intervention. Patient verbalized understanding. After Visit Summary given.     Rennis Harding, PA-C 08/18/21 1236

## 2021-10-06 IMAGING — DX DG CHEST 1V PORT
1 series · 1 of 1 positions shown · non-contrast
Comparison: CT Abdomen and Pelvis 05/20/2020.

CLINICAL DATA: 49-year-old male with fever coughing and hemoptysis.
Positive TLSJM-0F. For 1 week.

EXAM:
PORTABLE CHEST 1 VIEW

[chest ap]
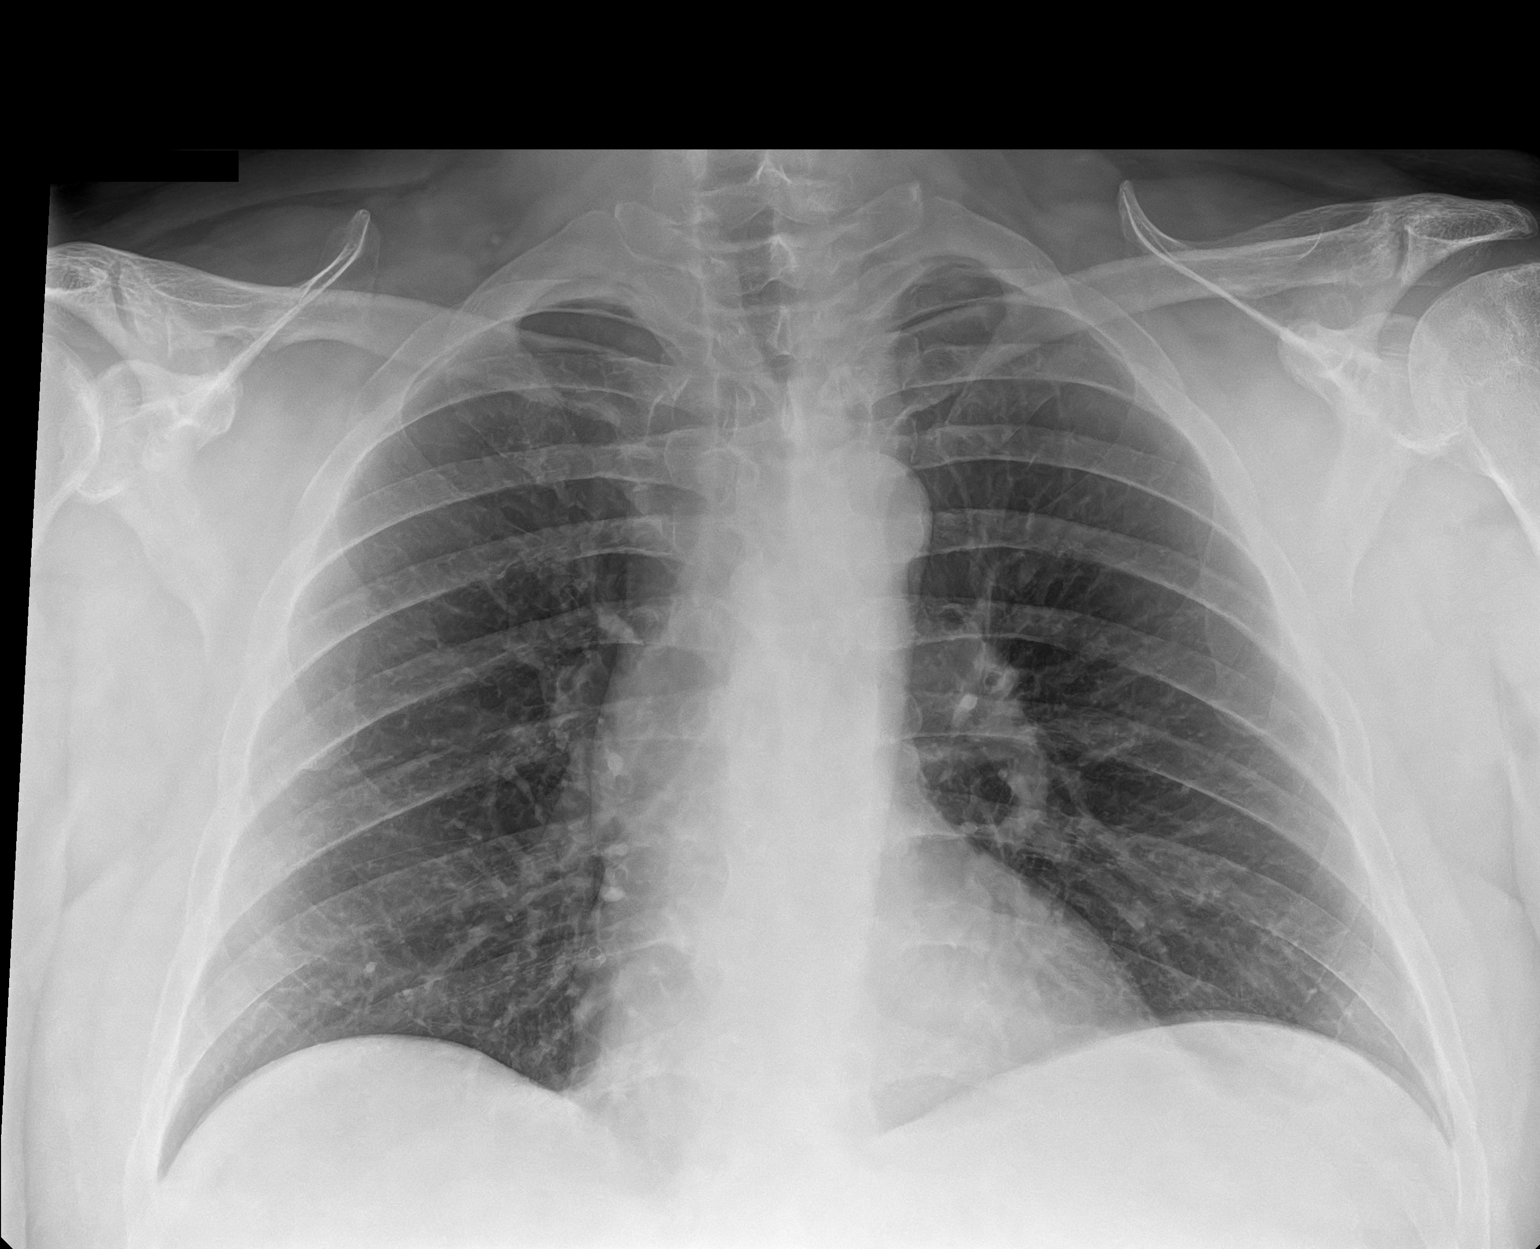

[1 of 1 positions shown; findings below may reference images not displayed]

FINDINGS: Portable AP upright view at 3999 hours. Normal lung volumes and
mediastinal contours. Allowing for portable technique the lungs are
clear. No pneumothorax. Visualized tracheal air column is within
normal limits. Negative visible osseous structures.
IMPRESSION: Negative portable chest.

## 2021-10-06 IMAGING — US US EXTREM  UP VENOUS*R*
1 series · 13 of 24 positions shown · non-contrast
Comparison: None.

CLINICAL DATA: Edema X 1 day



[Series 1: us venous img upper uni right (dvt) · portal-venous · 13 of 36 slices shown]
[im 1/36]
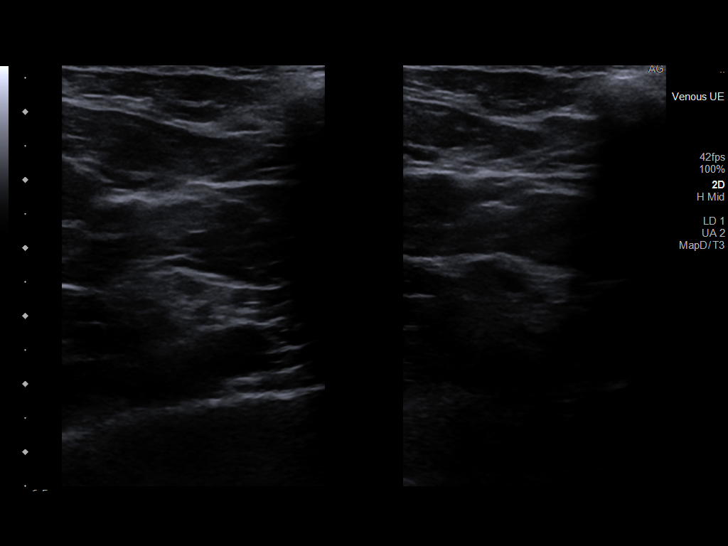
[im 4/36]
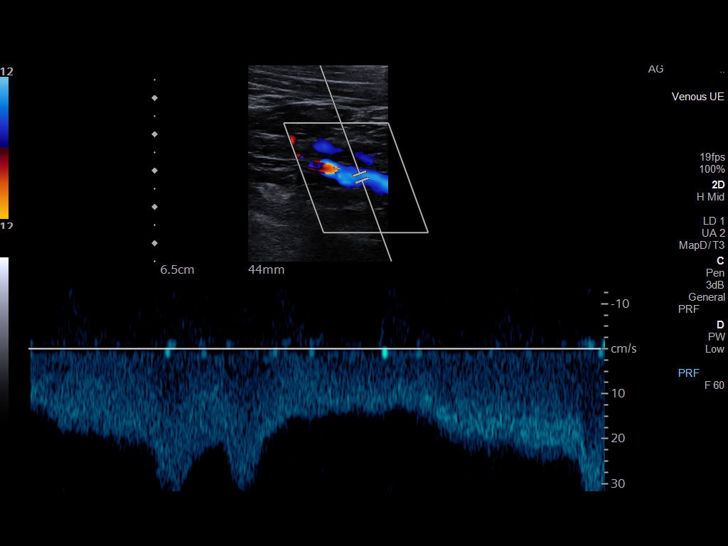
[im 7/36]
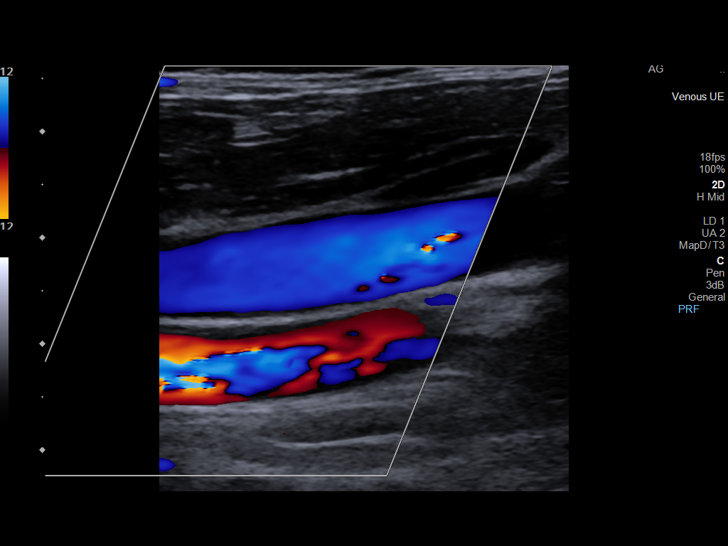
[im 10/36]
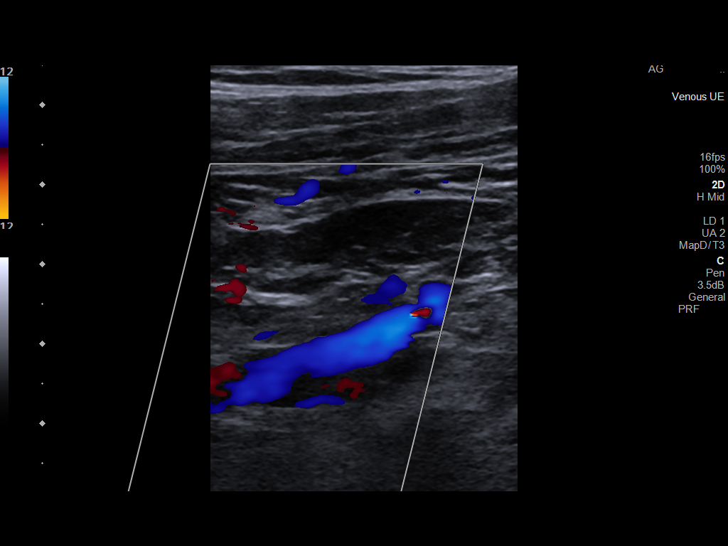
[im 13/36]
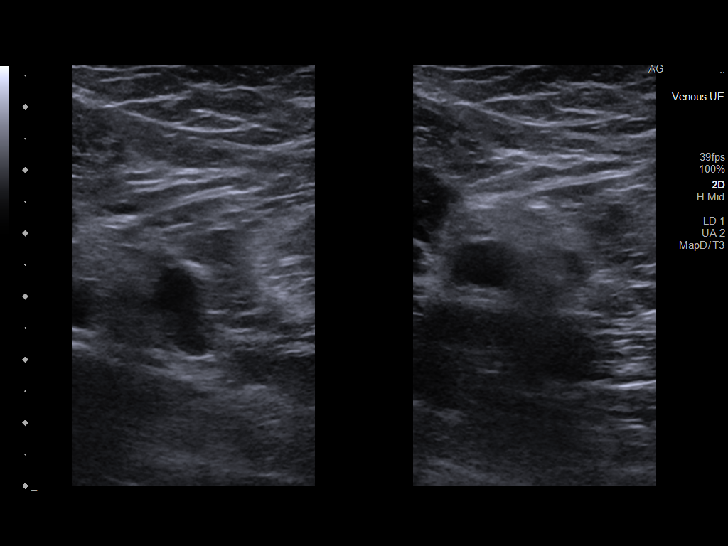
[im 16/36]
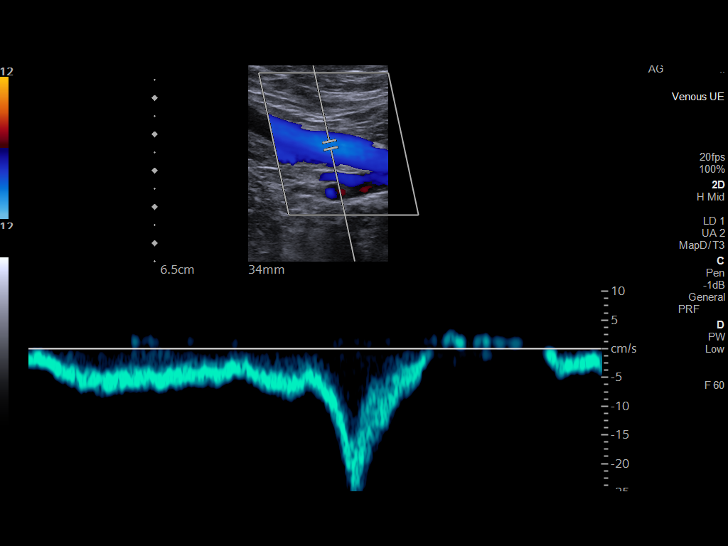
[im 19/36]
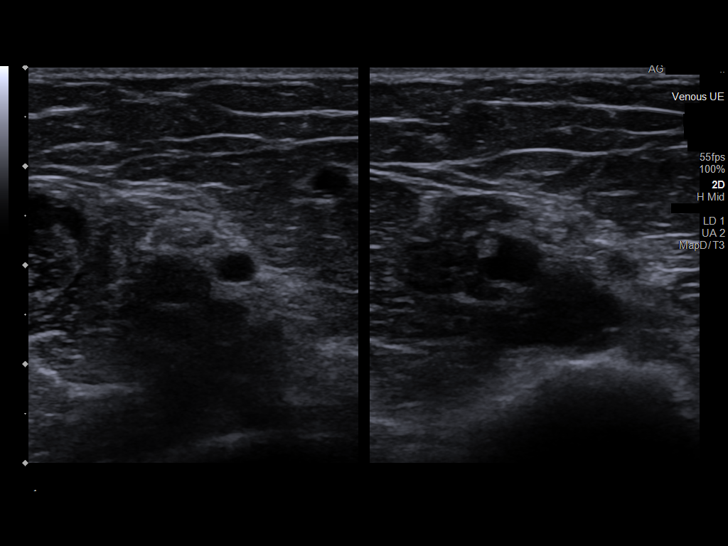
[im 20/36]
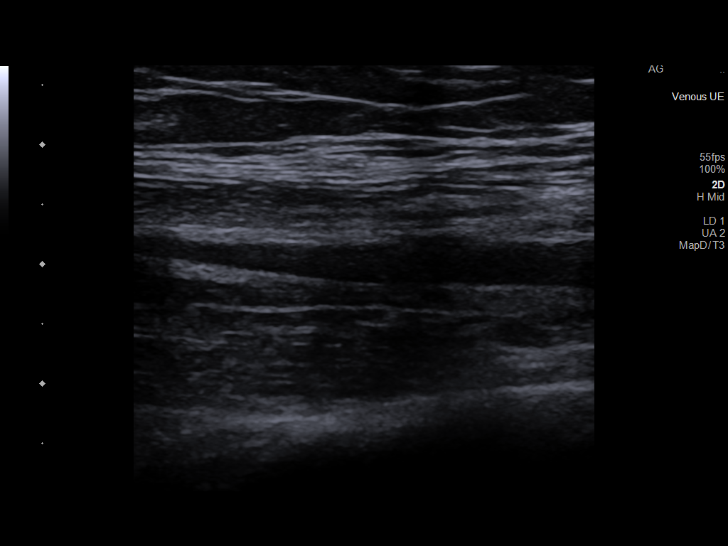
[im 23/36]
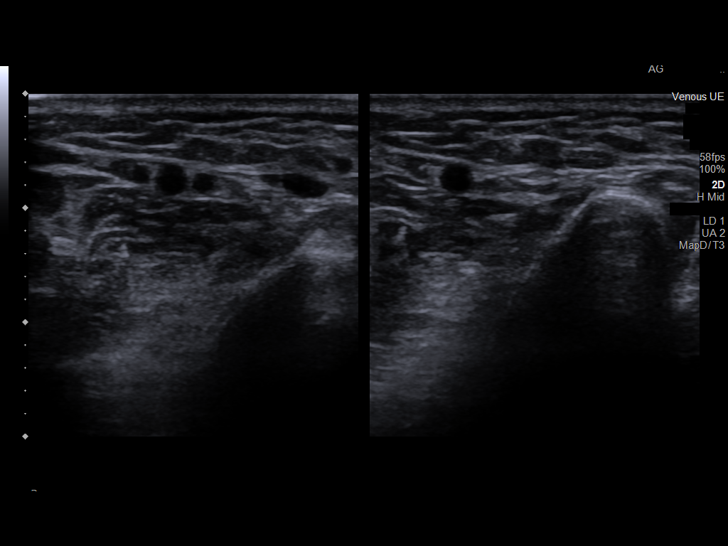
[im 26/36]
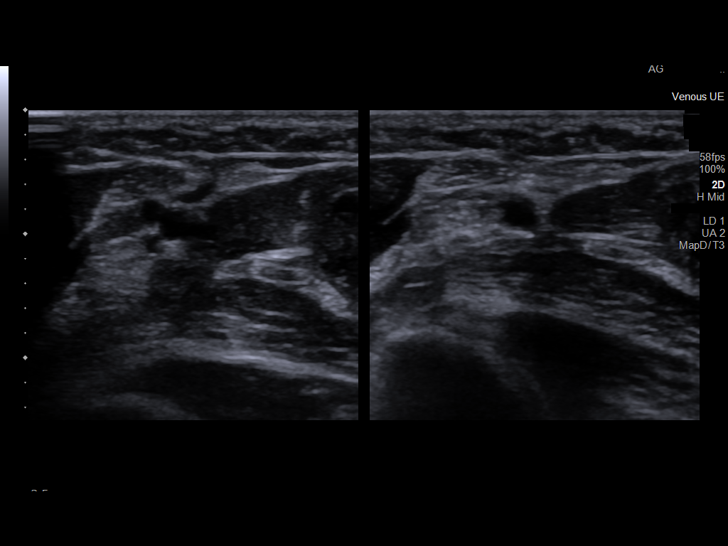
[im 29/36]
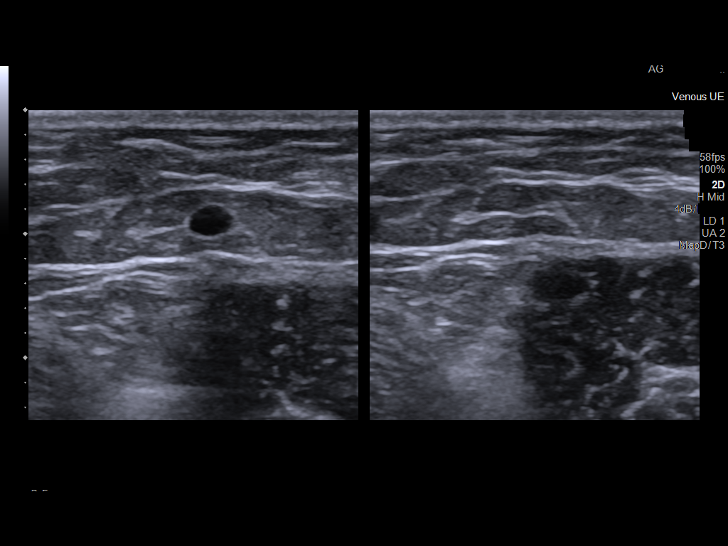
[im 32/36]
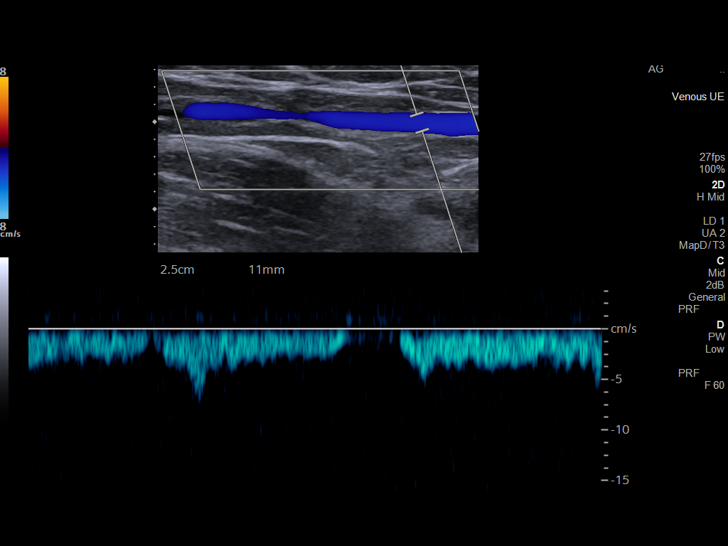
[im 36/36]
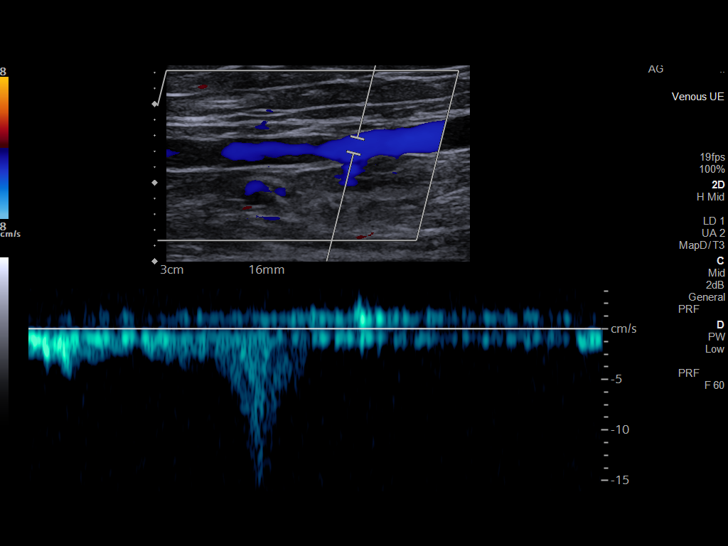

[13 of 24 positions shown; findings below may reference images not displayed]

FINDINGS: Contralateral Subclavian Vein: Respiratory phasicity is normal and
symmetric with the symptomatic side. No evidence of thrombus. Normal
compressibility.

Internal Jugular Vein: No evidence of thrombus. Normal
compressibility, respiratory phasicity and response to augmentation.

Subclavian Vein: No evidence of thrombus. Normal compressibility,
respiratory phasicity and response to augmentation.

Axillary Vein: No evidence of thrombus. Normal compressibility,
respiratory phasicity and response to augmentation.

Cephalic Vein: No evidence of thrombus. Normal compressibility,
respiratory phasicity and response to augmentation.

Basilic Vein: No evidence of thrombus. Normal compressibility,
respiratory phasicity and response to augmentation.

Brachial Veins: No evidence of thrombus. Normal compressibility,
respiratory phasicity and response to augmentation.

Radial Veins: No evidence of thrombus. Normal compressibility,
respiratory phasicity and response to augmentation.

Ulnar Veins: No evidence of thrombus. Normal compressibility,
respiratory phasicity and response to augmentation.

Venous Reflux:  None visualized.

Other Findings:  None visualized.
IMPRESSION: No evidence of DVT within the right upper extremity.

## 2022-01-12 ENCOUNTER — Ambulatory Visit
Admission: EM | Admit: 2022-01-12 | Discharge: 2022-01-12 | Disposition: A | Discharge status: Home / Self Care | Payer: BC Managed Care – PPO | Attending: Emergency Medicine | Admitting: Emergency Medicine

## 2022-01-12 ENCOUNTER — Other Ambulatory Visit: Payer: Self-pay

## 2022-01-12 DIAGNOSIS — S29019A Strain of muscle and tendon of unspecified wall of thorax, initial encounter: Secondary | ICD-10-CM

## 2022-01-12 DIAGNOSIS — M130 Polyarthritis, unspecified: Secondary | ICD-10-CM

## 2022-01-12 MED ORDER — TIZANIDINE HCL 4 MG PO TABS: 4.0000 mg | ORAL_TABLET | Freq: Three times a day (TID) | ORAL | 0 refills | Status: DC | PRN

## 2022-01-12 MED ORDER — IBUPROFEN 600 MG PO TABS: 600.0000 mg | ORAL_TABLET | Freq: Four times a day (QID) | ORAL | 0 refills | Status: DC | PRN

## 2022-01-12 MED ORDER — PREDNISONE 10 MG (21) PO TBPK: ORAL_TABLET | ORAL | 0 refills | Status: DC

## 2022-01-12 NOTE — ED Triage Notes (Signed)
Patient states that his arthritis started flaring up in both of his hands, the left knee and his back since Wednesday  Patient states that he has tried Naproxen and ice and heat on his back

## 2022-01-12 NOTE — Discharge Instructions (Addendum)
I suspect you are having a rheumatoid arthritis flare.  Finish the prednisone, even if you feel better.  Take 600 mg of ibuprofen combined with 1000 mg of Tylenol together 3-4 times a day.  Discontinue Naprosyn.  Zanaflex will help with muscle spasms in your back.  Heat or ice, whichever feels better, with gentle stretching and deep tissue massage for your back.

## 2022-01-12 NOTE — ED Provider Notes (Signed)
HPI  SUBJECTIVE:  Nicholas Harper is a 51 y.o. male who presents with 3 days of left knee pain, especially with extension, with a knot on it.  No joint erythema, swelling, known trauma.  He also reports bilateral hand pain, swelling along the MCPs and fingers of each hand.  It is worst at the right middle MCP and PIP.  He states that his wrists are painful, and that his difficulty bending/using his hands.  No fevers, numbness or tingling, erythema, increased temperature.  He also reports constant thoracic pain for the past 2 days.  He denies recent heavy lifting, low back pain.  He has tried ice, heat on his back and Naprosyn with improvement in his symptoms.  His hands are worse with use, knee is worse with extension.  He states that this does not feel or appear to be similar to previous episodes of gout.  He has a past medical history of arthritis in his back, a provisional diagnosis of rheumatoid arthritis, gout.  No history of osteoarthritis, chronic kidney disease.  PMD: In [place].  He is looking for a new primary care provider.  He has an upcoming appointment with rheumatology in the middle of February.    Past Medical History:  Diagnosis Date   Gout     Past Surgical History:  Procedure Laterality Date   APPENDECTOMY      Family History  Problem Relation Age of Onset   Cancer Mother    COPD Father     Social History   Tobacco Use   Smoking status: Never   Smokeless tobacco: Never  Vaping Use   Vaping Use: Never used  Substance Use Topics   Alcohol use: Not Currently   Drug use: Never    No current facility-administered medications for this encounter.  Current Outpatient Medications:    ibuprofen (ADVIL) 600 MG tablet, Take 1 tablet (600 mg total) by mouth every 6 (six) hours as needed., Disp: 30 tablet, Rfl: 0   predniSONE (STERAPRED UNI-PAK 21 TAB) 10 MG (21) TBPK tablet, Dispense one 6 day pack. Take as directed with food., Disp: 21 tablet, Rfl: 0   tiZANidine  (ZANAFLEX) 4 MG tablet, Take 1 tablet (4 mg total) by mouth every 8 (eight) hours as needed for muscle spasms., Disp: 30 tablet, Rfl: 0  No Known Allergies   ROS  As noted in HPI.   Physical Exam  BP (!) 130/92 (BP Location: Right Arm)    Pulse (!) 102    Temp 98.7 F (37.1 C) (Oral)    Resp 18    SpO2 94%   Constitutional: Well developed, well nourished, no acute distress Eyes:  EOMI, conjunctiva normal bilaterally HENT: Normocephalic, atraumatic,mucus membranes moist Respiratory: Normal inspiratory effort Cardiovascular: Normal rate GI: nondistended skin: No rash, skin intact Musculoskeletal:  Right hand: Diffuse dorsal swelling, swelling along the fingers.  Tenderness along the middle and ring finger.  Diffuse tenderness over the hand, wrist.  Pain with all wrist range of motion, supination, pronation.  Limited mobility in the fingers.  Tender node at the second MCP.  RP 2+.  Sensation grossly intact.  No evidence of cellulitis.    Left hand.  Some swelling over the fingers.  Tenderness along the middle finger.  Tenderness of the wrist.  Pain with all wrist range of motion, supination, pronation, limited finger mobility.  RP 2+.  Sensation grossly intact.  No evidence of cellulitis, infection.   Left knee: Nontender nodule lateral joint.  Positive  crepitus.  Knee joint otherwise stable, nontender.  No erythema, swelling, increased temperature.   Back: No C, T, L-spine tenderness.  Positive bilateral mid thoracic tenderness, spasm.  No paralumbar, trapezius tenderness. Neurologic: Alert & oriented x 3, no focal neuro deficits Psychiatric: Speech and behavior appropriate   ED Course   Medications - No data to display  No orders of the defined types were placed in this encounter.   No results found for this or any previous visit (from the past 24 hour(s)). No results found.  ED Clinical Impression  1. Polyarticular arthritis   2. Thoracic myofascial strain, initial  encounter      ED Assessment/Plan  Patient with polyarticular arthritis.  It does not appear to be gout, or septic.  Suspect rheumatoid arthritis flare.  He also appears to have some tenderness and muscle spasm along his back.  There is no bony tenderness.  Home with a 6-day prednisone taper, Tylenol/ibuprofen, discontinue Naprosyn, try Zanaflex, continue heat or ice, whichever feels better, gentle stretching, deep tissue massage for the back.  Follow-up with [facility] ASAP as he states that he is looking for a new PCP.  He has follow-up appointment with rheumatology in the middle of February.   Discussed MDM, treatment plan, and plan for follow-up with patient. Discussed sn/sx that should prompt return to the ED. patient agrees with plan.   Meds ordered this encounter  Medications   ibuprofen (ADVIL) 600 MG tablet    Sig: Take 1 tablet (600 mg total) by mouth every 6 (six) hours as needed.    Dispense:  30 tablet    Refill:  0   tiZANidine (ZANAFLEX) 4 MG tablet    Sig: Take 1 tablet (4 mg total) by mouth every 8 (eight) hours as needed for muscle spasms.    Dispense:  30 tablet    Refill:  0   predniSONE (STERAPRED UNI-PAK 21 TAB) 10 MG (21) TBPK tablet    Sig: Dispense one 6 day pack. Take as directed with food.    Dispense:  21 tablet    Refill:  0      *This clinic note was created using [employer]. Therefore, there may be occasional mistakes despite careful proofreading.  ?    Melynda Ripple, MD 01/13/22 [phone]

## 2022-03-26 ENCOUNTER — Ambulatory Visit
Admission: EM | Admit: 2022-03-26 | Discharge: 2022-03-26 | Disposition: A | Discharge status: Home / Self Care | Payer: 59

## 2022-03-26 DIAGNOSIS — M25522 Pain in left elbow: Secondary | ICD-10-CM

## 2022-03-26 DIAGNOSIS — M25561 Pain in right knee: Secondary | ICD-10-CM | POA: Diagnosis not present

## 2022-03-26 DIAGNOSIS — M25562 Pain in left knee: Secondary | ICD-10-CM

## 2022-03-26 DIAGNOSIS — M2550 Pain in unspecified joint: Secondary | ICD-10-CM

## 2022-03-26 DIAGNOSIS — Z8739 Personal history of other diseases of the musculoskeletal system and connective tissue: Secondary | ICD-10-CM | POA: Diagnosis not present

## 2022-03-26 MED ORDER — PREDNISONE 50 MG PO TABS: 50.0000 mg | ORAL_TABLET | Freq: Every day | ORAL | 0 refills | Status: DC

## 2022-03-26 NOTE — ED Triage Notes (Signed)
Pt reports bilateral knee pain and bump, left elbow pain and bump x 2 days.  States the pain is related to arthritis flare up. Aleve gives no relief.  ?

## 2022-03-26 NOTE — ED Provider Notes (Signed)
?[facility] ? ? ?MRN: [id number] DOB: 24-Apr-1971 ? ?Subjective:  ? ?Nicholas Harper is a 51 y.o. male presenting for 2-day history of recurrent bilateral knee pain, left elbow pain.  Patient was previously working with his doctor in [place].  He was diagnosed with rheumatoid arthritis.  Was supposed to follow-up with them but they canceled his appointment and then did not reschedule him for months out after that.  Denies any particular fall, trauma.  Was previously diagnosed with gout as well. ? ?No current facility-administered medications for this encounter. ? ?Current Outpatient Medications:  ?  ibuprofen (ADVIL) 600 MG tablet, Take 1 tablet (600 mg total) by mouth every 6 (six) hours as needed., Disp: 30 tablet, Rfl: 0 ?  predniSONE (STERAPRED UNI-PAK 21 TAB) 10 MG (21) TBPK tablet, Dispense one 6 day pack. Take as directed with food., Disp: 21 tablet, Rfl: 0 ?  tiZANidine (ZANAFLEX) 4 MG tablet, Take 1 tablet (4 mg total) by mouth every 8 (eight) hours as needed for muscle spasms., Disp: 30 tablet, Rfl: 0  ? ?No Known Allergies ? ?Past Medical History:  ?Diagnosis Date  ? Gout   ?  ? ?Past Surgical History:  ?Procedure Laterality Date  ? APPENDECTOMY    ? ? ?Family History  ?Problem Relation Age of Onset  ? Cancer Mother   ? COPD Father   ? ? ?Social History  ? ?Tobacco Use  ? Smoking status: Never  ? Smokeless tobacco: Never  ?Vaping Use  ? Vaping Use: Never used  ?Substance Use Topics  ? Alcohol use: Not Currently  ? Drug use: Never  ? ? ?ROS ? ? ?Objective:  ? ?Vitals: ?BP 140/89 (BP Location: Right Arm)   Pulse 89   Temp 98.4 ?F (36.9 ?C) (Oral)   Resp 18   SpO2 96%  ? ?Physical Exam ?Constitutional:   ?   General: He is not in acute distress. ?   Appearance: Normal appearance. He is well-developed and normal weight. He is not ill-appearing, toxic-appearing or diaphoretic.  ?HENT:  ?   Head: Normocephalic and atraumatic.  ?   Right Ear: External ear normal.  ?   Left Ear:  External ear normal.  ?   Nose: Nose normal.  ?   Mouth/Throat:  ?   Pharynx: Oropharynx is clear.  ?Eyes:  ?   General: No scleral icterus.    ?   Right eye: No discharge.     ?   Left eye: No discharge.  ?   Extraocular Movements: Extraocular movements intact.  ?Cardiovascular:  ?   Rate and Rhythm: Normal rate.  ?Pulmonary:  ?   Effort: Pulmonary effort is normal.  ?Musculoskeletal:  ?   Left elbow: Swelling and deformity present. No effusion or lacerations. Normal range of motion. Tenderness present.  ?   Right hand: Swelling, tenderness and bony tenderness present. No deformity or lacerations. Decreased range of motion. Normal strength.  ?   Cervical back: Normal range of motion.  ?   Right knee: Swelling and bony tenderness present. No deformity, effusion, erythema, ecchymosis, lacerations or crepitus. Decreased range of motion. Tenderness present over the lateral joint line. No medial joint line or patellar tendon tenderness. Normal alignment.  ?   Left knee: Bony tenderness present. No swelling, deformity, effusion, erythema, ecchymosis, lacerations or crepitus. Decreased range of motion. Tenderness (over patella) present. No medial joint line, lateral joint line or patellar tendon tenderness. Normal alignment.  ?   Comments: Ambulates  gingerly favoring his knees.  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  ?Neurological:  ?   Mental Status: He is alert and oriented to person, place, and time.  ?Psychiatric:     ?   Mood and Affect: Mood normal.     ?   Behavior: Behavior normal.     ?   Thought Content: Thought content normal.     ?   Judgment: Judgment normal.  ? ? ? ? ? ? ? ?Assessment and Plan :  ? ?PDMP not reviewed this encounter. ? ?1. Multiple joint pain   ?2. Acute bilateral knee pain   ?3. Left elbow pain   ?4. History of rheumatoid arthritis   ? ?Patient responded well to the prednisone course.  We will repeat a 50 mg 5-day course.  Patient is to follow-up with rheumatology.  I attempted an  internal referral. Counseled patient on potential for adverse effects with medications prescribed/recommended today, ER and return-to-clinic precautions discussed, patient verbalized understanding. ? ?  ?Wallis Bamberg, PA-C ?03/26/22 4503 ? ?

## 2022-06-10 ENCOUNTER — Ambulatory Visit
Admission: EM | Admit: 2022-06-10 | Discharge: 2022-06-10 | Disposition: A | Discharge status: Home / Self Care | Payer: 59

## 2022-06-10 DIAGNOSIS — M25532 Pain in left wrist: Secondary | ICD-10-CM | POA: Diagnosis not present

## 2022-06-10 DIAGNOSIS — M25522 Pain in left elbow: Secondary | ICD-10-CM

## 2022-06-10 DIAGNOSIS — M2550 Pain in unspecified joint: Secondary | ICD-10-CM

## 2022-06-10 HISTORY — DX: Unspecified osteoarthritis, unspecified site: M19.90

## 2022-06-10 MED ORDER — PREDNISONE 10 MG (21) PO TBPK: ORAL_TABLET | Freq: Every day | ORAL | 0 refills | Status: DC

## 2022-06-10 MED ORDER — ACETAMINOPHEN ER 650 MG PO TBCR: 650.0000 mg | EXTENDED_RELEASE_TABLET | Freq: Three times a day (TID) | ORAL | 0 refills | Status: AC | PRN

## 2022-06-10 NOTE — ED Triage Notes (Signed)
Pt reports pain and swelling in left wrist and left elbow x 2 days. Pt reports can be relate to gout or arthritis flare up. Naproxen and ibuprofen gives no relief.

## 2022-09-21 DIAGNOSIS — M1711 Unilateral primary osteoarthritis, right knee: Secondary | ICD-10-CM | POA: Diagnosis not present

## 2023-02-07 ENCOUNTER — Encounter: Payer: Self-pay | Admitting: Emergency Medicine

## 2023-02-07 ENCOUNTER — Ambulatory Visit
Admission: EM | Admit: 2023-02-07 | Discharge: 2023-02-07 | Disposition: A | Discharge status: Home / Self Care | Payer: 59 | Attending: Nurse Practitioner | Admitting: Nurse Practitioner

## 2023-02-07 DIAGNOSIS — M1990 Unspecified osteoarthritis, unspecified site: Secondary | ICD-10-CM | POA: Diagnosis not present

## 2023-02-07 MED ORDER — PREDNISONE 50 MG PO TABS: ORAL_TABLET | ORAL | 0 refills | Status: AC

## 2023-02-07 NOTE — ED Triage Notes (Signed)
Hx of covid over a week ago.  Headache, chest hurts, productive cough at morning time. and right foot pain around heel and left great toe pain.  States has taken naprosyn for pain.

## 2023-02-07 NOTE — ED Provider Notes (Signed)
[facility]    CSN: [id number] Arrival date & time: 02/07/23  1139      History   Chief Complaint No chief complaint on file.   HPI Nicholas Harper is a 52 y.o. male.   The history is provided by the patient.   The patient presents for complaints of pain in the right foot around his heel and left great toe pain.  Symptoms started over the last several days.  Patient states he has a history of redness and swelling in his joints.  He states that he has been treated for gout previously.  Patient states that he was to see a rheumatologist, but the appointment kept getting canceled, so he states he stopped trying to go.  He denies fever, chills, chest pain, abdominal pain, nausea, vomiting, diarrhea, inability to bear weight, injury or trauma, numbness, tingling, or radiation of pain.  Patient has been seen in this clinic on more than 1 occasion for inflammation of his joints and polyarthralgia.  He states he has been taking Tylenol for his symptoms with minimal relief.  Past Medical History:  Diagnosis Date   Arthritis    Gout     There are no problems to display for this patient.   Past Surgical History:  Procedure Laterality Date   APPENDECTOMY         Home Medications    Prior to Admission medications   Medication Sig Start Date End Date Taking? Authorizing Provider  predniSONE (DELTASONE) 50 MG tablet Take 1 tablet with breakfast for the next 5 days. 02/07/23  Yes Andrewjames Weirauch-Warren, Alda Lea, NP  acetaminophen (TYLENOL 8 HOUR) 650 MG CR tablet Take 1 tablet (650 mg total) by mouth every 8 (eight) hours as needed for pain. 06/10/22   Emeterio Balke-Warren, Alda Lea, NP  ibuprofen (ADVIL) 200 MG tablet Take 200 mg by mouth every 6 (six) hours as needed.    [provider]  naproxen sodium (ALEVE) 220 MG tablet Take 220 mg by mouth.    [provider]    Family History Family History  Problem Relation Age of Onset   Cancer Mother    COPD Father      Social History Social History   Tobacco Use   Smoking status: Never   Smokeless tobacco: Never  Vaping Use   Vaping Use: Never used  Substance Use Topics   Alcohol use: Not Currently   Drug use: Never     Allergies   Patient has no known allergies.   Review of Systems Review of Systems Per HPI   Physical Exam Triage Vital Signs ED Triage Vitals  Enc Vitals Group     BP 02/07/23 1145 (!) 179/108     Pulse Rate 02/07/23 1145 97     Resp 02/07/23 1145 18     Temp 02/07/23 1145 98.2 F (36.8 C)     Temp Source 02/07/23 1145 Oral     SpO2 02/07/23 1145 95 %     Weight --      Height --      Head Circumference --      Peak Flow --      Pain Score 02/07/23 1147 6     Pain Loc --      Pain Edu? --      Excl. in [place]? --    No data found.  Updated Vital Signs BP (!) 179/108 (BP Location: Right Arm)   Pulse 97   Temp 98.2 F (36.8 C) (  Oral)   Resp 18   SpO2 95%   Visual Acuity Right Eye Distance:   Left Eye Distance:   Bilateral Distance:    Right Eye Near:   Left Eye Near:    Bilateral Near:     Physical Exam Vitals and nursing note reviewed.  Constitutional:      General: He is not in acute distress.    Appearance: Normal appearance.  HENT:     Head: Normocephalic.  Eyes:     Pupils: Pupils are equal, round, and reactive to light.  Cardiovascular:     Rate and Rhythm: Normal rate and regular rhythm.     Pulses: Normal pulses.     Heart sounds: Normal heart sounds.  Pulmonary:     Effort: Pulmonary effort is normal.     Breath sounds: Normal breath sounds.  Abdominal:     General: Bowel sounds are normal.     Palpations: Abdomen is soft.  Musculoskeletal:     Cervical back: Normal range of motion.     Left foot: Normal capillary refill. Swelling and tenderness present. Normal pulse.     Comments: Swelling and deformity present to the left great toe and to the right lateral malleolus and right calneus. No effusion or lacerations. Normal  range of motion. Tenderness present.   Skin:    General: Skin is warm and dry.  Neurological:     General: No focal deficit present.     Mental Status: He is alert and oriented to person, place, and time.  Psychiatric:        Mood and Affect: Mood normal.        Behavior: Behavior normal.      UC Treatments / Results  Labs (all labs ordered are listed, but only abnormal results are displayed) Labs Reviewed - No data to display  EKG   Radiology No results found.  Procedures Procedures (including critical care time)  Medications Ordered in UC Medications - No data to display  Initial Impression / Assessment and Plan / UC Course  I have reviewed the triage vital signs and the nursing notes.  Pertinent labs & imaging results that were available during my care of the patient were reviewed by me and considered in my medical decision making (see chart for details).  The patient is well-appearing, he is in no acute distress, he is hypertensive.  Patient denies chest pain, shortness of breath, difficulty breathing, or lower extremity edema.  Discussed elevated blood pressure with the patient and gave patient's strict indications of when to go to the emergency department.  Patient was also given information to establish care with a primary care provider.   Patient presents for recurrence of polyarthralgia in his bilateral feet.  Patient never followed up with rheumatology as he states they kept canceling his appointment.  He currently does not have a primary care physician.  Ongoing symptoms consistent with polyarthralgia versus gout. Will treat patient's symptoms with prednisone 50 mg for the next 5 days as this has been effective for him in the past.  Patient advised to take over-the-counter Tylenol arthritis strength for breakthrough pain.  Supportive care recommendations were given to the patient to follow a low purine diet, and to increase his fluid intake.  Patient was given  information for [facility] to establish care.  Advised patient that it is indicative that he follow-up to establish care with a primary care physician for his ongoing complaints.  Patient verbalizes understanding and is  in agreement with this plan of care.  All questions were answered.  Patient stable for discharge. Final Clinical Impressions(s) / UC Diagnoses   Final diagnoses:  Inflammation of joint     Discharge Instructions      Take medication as prescribed. May take Tylenol Arthritis strength for breakthrough pain. As discussed, please follow-up with your primary care office to establish care.  You have many chronic conditions that need to be addressed and managed.  I am providing you information for [facility] to call and schedule an appointment. Follow-up as needed.      ED Prescriptions     Medication Sig Dispense Auth. Provider   predniSONE (DELTASONE) 50 MG tablet Take 1 tablet with breakfast for the next 5 days. 5 tablet Tailer Volkert-Warren, Alda Lea, NP      PDMP not reviewed this encounter.   Tish Men, NP 02/07/23 1420

## 2023-02-07 NOTE — Discharge Instructions (Addendum)
Take medication as prescribed. May take Tylenol Arthritis strength for breakthrough pain. As discussed, please follow-up with your primary care office to establish care.  You have many chronic conditions that need to be addressed and managed.  I am providing you information for [facility] to call and schedule an appointment. Follow-up as needed.

## 2023-05-20 ENCOUNTER — Telehealth: Payer: Self-pay | Admitting: Nurse Practitioner

## 2023-05-20 ENCOUNTER — Ambulatory Visit
Admission: EM | Admit: 2023-05-20 | Discharge: 2023-05-20 | Disposition: A | Discharge status: Home / Self Care | Payer: 59 | Attending: Nurse Practitioner | Admitting: Nurse Practitioner

## 2023-05-20 DIAGNOSIS — M7989 Other specified soft tissue disorders: Secondary | ICD-10-CM | POA: Diagnosis not present

## 2023-05-20 DIAGNOSIS — M2550 Pain in unspecified joint: Secondary | ICD-10-CM | POA: Diagnosis not present

## 2023-05-20 DIAGNOSIS — Z8739 Personal history of other diseases of the musculoskeletal system and connective tissue: Secondary | ICD-10-CM

## 2023-05-20 MED ORDER — DEXAMETHASONE SODIUM PHOSPHATE 10 MG/ML IJ SOLN
10.0000 mg | INTRAMUSCULAR | Status: AC
Start: 2023-05-20 — End: 2023-05-20
  Administered 2023-05-20: 10 mg via INTRAMUSCULAR

## 2023-05-20 MED ORDER — PREDNISONE 10 MG (21) PO TBPK: ORAL_TABLET | Freq: Every day | ORAL | 0 refills | Status: AC

## 2023-05-20 NOTE — ED Triage Notes (Addendum)
Pt presents to UC w/ c/o pain flare up in bilateral knuckles and left elbow starting yesterday morning. Tylenol arthritis and naproxen taken without relief. Hx gout.

## 2023-05-20 NOTE — Telephone Encounter (Signed)
Prednisone taper sent to patient's preferred pharmacy after patient called stating prescription was not sent.  Patient was notified that prescription was being sent immediately.

## 2023-05-20 NOTE — ED Provider Notes (Signed)
[facility]    CSN: [id number] Arrival date & time: 05/20/23  1244      History   Chief Complaint No chief complaint on file.   HPI Nicholas Harper is a 52 y.o. male.   The history is provided by the patient.   The patient presents for complaints of pain in the bilateral hands that started one day ago. Patient states he has a history of redness and swelling in his joints, gout. He states that he has been treated for gout previously.  Patient states that he did see another physician, but he really did not care for him that much.  Patient states that the physician did treat him for gout, but did not authorize further refills.  Patient states he is scheduled to see another doctor on 6/19. He denies fever, chills, chest pain, abdominal pain, nausea, vomiting, diarrhea, inability to bear weight, injury or trauma, numbness, tingling, or radiation of pain. Patient has been seen in this clinic on more than 1 occasion for inflammation of his joints and polyarthralgia. He states he has been taking Tylenol for his symptoms with minimal relief.  Past Medical History:  Diagnosis Date   Arthritis    Gout     There are no problems to display for this patient.   Past Surgical History:  Procedure Laterality Date   APPENDECTOMY         Home Medications    Prior to Admission medications   Medication Sig Start Date End Date Taking? Authorizing Provider  acetaminophen (TYLENOL 8 HOUR) 650 MG CR tablet Take 1 tablet (650 mg total) by mouth every 8 (eight) hours as needed for pain. 06/10/22   Airam Runions-Warren, Sadie Haber, NP  ibuprofen (ADVIL) 200 MG tablet Take 200 mg by mouth every 6 (six) hours as needed.    [provider]  naproxen sodium (ALEVE) 220 MG tablet Take 220 mg by mouth.    [provider]  predniSONE (DELTASONE) 50 MG tablet Take 1 tablet with breakfast for the next 5 days. 02/07/23   Alorah Mcree-Warren, Sadie Haber, NP    Family History Family History  Problem  Relation Age of Onset   Cancer Mother    COPD Father     Social History Social History   Tobacco Use   Smoking status: Never   Smokeless tobacco: Never  Vaping Use   Vaping Use: Never used  Substance Use Topics   Alcohol use: Not Currently   Drug use: Never     Allergies   Patient has no known allergies.   Review of Systems Review of Systems Per HPI  Physical Exam Triage Vital Signs ED Triage Vitals  Enc Vitals Group     BP 05/20/23 1428 (!) 175/128     Pulse Rate 05/20/23 1428 78     Resp 05/20/23 1428 16     Temp 05/20/23 1428 98.2 F (36.8 C)     Temp Source 05/20/23 1428 Oral     SpO2 05/20/23 1428 95 %     Weight --      Height --      Head Circumference --      Peak Flow --      Pain Score 05/20/23 1430 7     Pain Loc --      Pain Edu? --      Excl. in [place]? --    No data found.  Updated Vital Signs BP (!) 175/128 (BP Location: Right Arm)   Pulse  78   Temp 98.2 F (36.8 C) (Oral)   Resp 16   SpO2 95%   Visual Acuity Right Eye Distance:   Left Eye Distance:   Bilateral Distance:    Right Eye Near:   Left Eye Near:    Bilateral Near:     Physical Exam Vitals and nursing note reviewed.  Constitutional:      General: He is not in acute distress.    Appearance: Normal appearance.  HENT:     Head: Normocephalic.  Eyes:     Extraocular Movements: Extraocular movements intact.     Pupils: Pupils are equal, round, and reactive to light.  Cardiovascular:     Rate and Rhythm: Normal rate and regular rhythm.     Pulses: Normal pulses.     Heart sounds: Normal heart sounds.  Pulmonary:     Effort: Pulmonary effort is normal. No respiratory distress.     Breath sounds: Normal breath sounds. No stridor. No wheezing, rhonchi or rales.  Abdominal:     General: Bowel sounds are normal.     Palpations: Abdomen is soft.     Tenderness: There is no abdominal tenderness.  Musculoskeletal:     Right hand: Swelling, deformity and tenderness present.  Normal range of motion. Normal capillary refill. Normal pulse.     Left hand: Swelling, deformity and tenderness present. Normal range of motion. Normal capillary refill. Normal pulse.     Cervical back: Normal range of motion.     Comments: Swelling and deformity present multiple joints of the left and right hands. No effusion or lacerations. Normal range of motion. Tenderness present.    Skin:    General: Skin is warm and dry.  Neurological:     General: No focal deficit present.     Mental Status: He is alert and oriented to person, place, and time.  Psychiatric:        Mood and Affect: Mood normal.        Behavior: Behavior normal.      UC Treatments / Results  Labs (all labs ordered are listed, but only abnormal results are displayed) Labs Reviewed - No data to display  EKG   Radiology No results found.  Procedures Procedures (including critical care time)  Medications Ordered in UC Medications - No data to display  Initial Impression / Assessment and Plan / UC Course  I have reviewed the triage vital signs and the nursing notes.  Pertinent labs & imaging results that were available during my care of the patient were reviewed by me and considered in my medical decision making (see chart for details).  The patient is well-appearing, he is in no acute distress, he is hypertensive.  BP was rechecked at discharge, with minimal change.  Patient denies chest pain, shortness of breath, or difficulty breathing.  Patient with history of polyarticular arthritis and gout. Does not appear to be septic.  Suspect rheumatoid arthritis flare.  Prednisone 10mg  6-day taper, Tylenol/ibuprofen, recommend heat or ice, whichever feels better, gentle stretching, deep tissue massage for the back.  Keep scheduled appointment with new physician in 2 weeks. Advised patient that it is indicative that he follow-up to establish care with a primary care physician for his ongoing complaints. Patient  verbalizes understanding and is in agreement with this plan of care. All questions were answered. Patient stable for discharge.    Final Clinical Impressions(s) / UC Diagnoses   Final diagnoses:  None   Discharge Instructions  None    ED Prescriptions   None    PDMP not reviewed this encounter.   Abran Cantor, NP 05/20/23 [phone]

## 2023-05-20 NOTE — Discharge Instructions (Addendum)
Take medication as prescribed. May take Tylenol Arthritis strength for breakthrough pain. May apply ice or heat as needed.  Ice for pain or swelling, heat for spasm or stiffness.  Apply for 20 minutes remove for 1 hour, then repeat as needed. Keep upcoming appointment scheduled for 06/05/2023. Follow-up as needed.

## 2023-06-02 ENCOUNTER — Encounter (HOSPITAL_COMMUNITY): Payer: Self-pay

## 2023-06-02 ENCOUNTER — Other Ambulatory Visit: Payer: Self-pay

## 2023-06-02 ENCOUNTER — Emergency Department (HOSPITAL_COMMUNITY)
Admission: EM | Admit: 2023-06-02 | Discharge: 2023-06-02 | Disposition: A | Discharge status: Home / Self Care | Payer: 59 | Attending: Emergency Medicine | Admitting: Emergency Medicine

## 2023-06-02 DIAGNOSIS — Z79899 Other long term (current) drug therapy: Secondary | ICD-10-CM | POA: Diagnosis not present

## 2023-06-02 DIAGNOSIS — S60464A Insect bite (nonvenomous) of right ring finger, initial encounter: Secondary | ICD-10-CM | POA: Diagnosis not present

## 2023-06-02 DIAGNOSIS — I10 Essential (primary) hypertension: Secondary | ICD-10-CM

## 2023-06-02 DIAGNOSIS — W57XXXA Bitten or stung by nonvenomous insect and other nonvenomous arthropods, initial encounter: Secondary | ICD-10-CM | POA: Insufficient documentation

## 2023-06-02 MED ORDER — ACETAMINOPHEN 500 MG PO TABS
1000.0000 mg | ORAL_TABLET | Freq: Once | ORAL | Status: AC
  Administered 2023-06-02: 1000 mg via ORAL
  Filled 2023-06-02: qty 2

## 2023-06-02 MED ORDER — AMLODIPINE BESYLATE 5 MG PO TABS
5.0000 mg | ORAL_TABLET | Freq: Once | ORAL | Status: AC
  Administered 2023-06-02: 5 mg via ORAL
  Filled 2023-06-02: qty 1

## 2023-06-02 MED ORDER — AMLODIPINE BESYLATE 5 MG PO TABS: 5.0000 mg | ORAL_TABLET | Freq: Every day | ORAL | 0 refills | Status: AC

## 2023-06-02 MED ORDER — HYDROCORTISONE 1 % EX CREA
TOPICAL_CREAM | Freq: Once | CUTANEOUS | Status: AC
  Filled 2023-06-02: qty 28

## 2023-06-02 NOTE — ED Provider Notes (Signed)
[place] EMERGENCY DEPARTMENT AT [facility] Provider Note   CSN: [id number] Arrival date & time: 06/02/23  4098     History  Chief Complaint  Patient presents with   Headache   Hypertension   Hand Pain    Nicholas Harper is a 52 y.o. male.  Pt is a 52 yo male with pmhx significant for gout and arthritis.  He has been told he has elevated bp in the past, but has never been on medication for it.  His pcp has retired, but he has not yet established with a new one.  Pt said he saw a rheumatologist in [place], had blood work and is supposed to go back on Wed, 6/19 for further eval.  Pt said he's noticed his bp has been elevated for the past few days at home.  He woke up this am with his left eye red and itchy.  He has an insect bite on his right 4th finger that is itchy.  He does have a small headache.       Home Medications Prior to Admission medications   Medication Sig Start Date End Date Taking? Authorizing Provider  amLODipine (NORVASC) 5 MG tablet Take 1 tablet (5 mg total) by mouth daily. 06/02/23  Yes Jacalyn Lefevre, MD  acetaminophen (TYLENOL 8 HOUR) 650 MG CR tablet Take 1 tablet (650 mg total) by mouth every 8 (eight) hours as needed for pain. 06/10/22   Leath-Warren, Sadie Haber, NP  ibuprofen (ADVIL) 200 MG tablet Take 200 mg by mouth every 6 (six) hours as needed.    [provider]  naproxen sodium (ALEVE) 220 MG tablet Take 220 mg by mouth.    [provider]  predniSONE (DELTASONE) 50 MG tablet Take 1 tablet with breakfast for the next 5 days. 02/07/23   Leath-Warren, Sadie Haber, NP  predniSONE (STERAPRED UNI-PAK 21 TAB) 10 MG (21) TBPK tablet Take by mouth daily. Take 6 tablets with breakfast on day 1. Take 5 tablets with breakfast on day 2. Take 4 tablets with breakfast on day 3. Take 3 tablets with breakfast on day 4. Take 2 tablets with breakfast on day 5. Take 1 tablet with breakfast on day 6. 05/20/23   Leath-Warren, Sadie Haber, NP       Allergies    Patient has no known allergies.    Review of Systems   Review of Systems  All other systems reviewed and are negative.   Physical Exam Updated Vital Signs BP (!) 178/119 (BP Location: Right Arm)   Pulse 83   Temp 97.9 F (36.6 C) (Oral)   Resp 18   Ht 5\' 10"  (1.778 m)   Wt 99.8 kg   SpO2 95%   BMI 31.57 kg/m  Physical Exam Vitals and nursing note reviewed.  Constitutional:      Appearance: He is well-developed.  HENT:     Head: Normocephalic and atraumatic.     Mouth/Throat:     Mouth: Mucous membranes are moist.     Pharynx: Oropharynx is clear.  Eyes:     Extraocular Movements: Extraocular movements intact.     Pupils: Pupils are equal, round, and reactive to light.  Cardiovascular:     Rate and Rhythm: Normal rate and regular rhythm.     Heart sounds: Normal heart sounds.  Pulmonary:     Effort: Pulmonary effort is normal.     Breath sounds: Normal breath sounds.  Abdominal:     General: Bowel sounds are  normal.     Palpations: Abdomen is soft.  Musculoskeletal:        General: Normal range of motion.     Cervical back: Normal range of motion and neck supple.     Comments: Gouty tophi on fingers  Skin:    General: Skin is warm.     Comments: Small hive right 4th finger  Neurological:     Mental Status: He is alert and oriented to person, place, and time.  Psychiatric:        Mood and Affect: Mood normal.        Speech: Speech normal.        Behavior: Behavior normal.     ED Results / Procedures / Treatments   Labs (all labs ordered are listed, but only abnormal results are displayed) Labs Reviewed - No data to display  EKG None  Radiology No results found.  Procedures Procedures    Medications Ordered in ED Medications  hydrocortisone cream 1 % (has no administration in time range)  amLODipine (NORVASC) tablet 5 mg (has no administration in time range)  acetaminophen (TYLENOL) tablet 1,000 mg (has no administration in  time range)    ED Course/ Medical Decision Making/ A&P                             Medical Decision Making Risk OTC drugs. Prescription drug management.   This patient presents to the ED for concern of htn, this involves an extensive number of treatment options, and is a complaint that carries with it a high risk of complications and morbidity.  The differential diagnosis includes essential htn   Co morbidities that complicate the patient evaluation  Gout, arthritis   Additional history obtained:  Additional history obtained from epic chart review  Medicines ordered and prescription drug management:  I ordered medication including amlodipine  for htn  Reevaluation of the patient after these medicines showed that the patient stayed the same I have reviewed the patients home medicines and have made adjustments as needed   Problem List / ED Course:  HTN:  upon reviewing pt's chart, he's been hypertensive on every recent doctor visit.  He will be given a dose of amlodipine here and give a rx for amlodipine.  Insect bite:  hydrocortisone crm applied   Reevaluation:  After the interventions noted above, I reevaluated the patient and found that they have :improved   Social Determinants of Health:  No pcp   Dispostion:  After consideration of the diagnostic results and the patients response to treatment, I feel that the patent would benefit from discharge with outpatient f/u.          Final Clinical Impression(s) / ED Diagnoses Final diagnoses:  Hypertension, unspecified type  Insect bite of right ring finger, initial encounter    Rx / DC Orders ED Discharge Orders          Ordered    amLODipine (NORVASC) 5 MG tablet  Daily        06/02/23 0851              Jacalyn Lefevre, MD 06/02/23 [phone]

## 2023-06-02 NOTE — Discharge Instructions (Addendum)
It is very important that you establish care with a PCP.  DASH diet for hypertension. Low purine diet for gout.

## 2023-06-02 NOTE — ED Triage Notes (Signed)
Per patient:  Headache  X3 days High Blood Pressure Not on BP meds SBP 160-195 the last 3 days Blood shot eyes Finger Swollen Right fourth finger Hx of gout

## 2023-08-13 DIAGNOSIS — M1990 Unspecified osteoarthritis, unspecified site: Secondary | ICD-10-CM | POA: Diagnosis not present

## 2023-08-13 DIAGNOSIS — M109 Gout, unspecified: Secondary | ICD-10-CM | POA: Diagnosis not present

## 2023-08-13 DIAGNOSIS — E669 Obesity, unspecified: Secondary | ICD-10-CM | POA: Diagnosis not present

## 2023-08-13 DIAGNOSIS — Z6832 Body mass index (BMI) 32.0-32.9, adult: Secondary | ICD-10-CM | POA: Diagnosis not present

## 2023-08-13 DIAGNOSIS — Z973 Presence of spectacles and contact lenses: Secondary | ICD-10-CM | POA: Diagnosis not present

## 2023-08-13 DIAGNOSIS — I10 Essential (primary) hypertension: Secondary | ICD-10-CM | POA: Diagnosis not present

## 2024-03-31 DIAGNOSIS — E669 Obesity, unspecified: Secondary | ICD-10-CM | POA: Diagnosis not present

## 2024-03-31 DIAGNOSIS — Z6830 Body mass index (BMI) 30.0-30.9, adult: Secondary | ICD-10-CM | POA: Diagnosis not present

## 2024-03-31 DIAGNOSIS — Z818 Family history of other mental and behavioral disorders: Secondary | ICD-10-CM | POA: Diagnosis not present

## 2024-03-31 DIAGNOSIS — Z8249 Family history of ischemic heart disease and other diseases of the circulatory system: Secondary | ICD-10-CM | POA: Diagnosis not present

## 2024-03-31 DIAGNOSIS — I10 Essential (primary) hypertension: Secondary | ICD-10-CM | POA: Diagnosis not present

## 2024-03-31 DIAGNOSIS — Z809 Family history of malignant neoplasm, unspecified: Secondary | ICD-10-CM | POA: Diagnosis not present

## 2024-03-31 DIAGNOSIS — M1990 Unspecified osteoarthritis, unspecified site: Secondary | ICD-10-CM | POA: Diagnosis not present
